# Patient Record
Sex: Male | Born: 1970 | Race: White | Hispanic: No | Smoking: Current every day smoker
Health system: Southern US, Community
[De-identification: ages and names within clinical notes are randomized; demographics above are authoritative.]

## PROBLEM LIST (undated history)

## (undated) ENCOUNTER — Emergency Department (HOSPITAL_COMMUNITY): Admission: EM | Payer: BC Managed Care – PPO

## (undated) DIAGNOSIS — F17200 Nicotine dependence, unspecified, uncomplicated: Secondary | ICD-10-CM

## (undated) DIAGNOSIS — R7303 Prediabetes: Secondary | ICD-10-CM

## (undated) DIAGNOSIS — J189 Pneumonia, unspecified organism: Secondary | ICD-10-CM

## (undated) DIAGNOSIS — N189 Chronic kidney disease, unspecified: Secondary | ICD-10-CM

## (undated) DIAGNOSIS — I10 Essential (primary) hypertension: Secondary | ICD-10-CM

## (undated) HISTORY — DX: Nicotine dependence, unspecified, uncomplicated: F17.200

## (undated) HISTORY — DX: Essential (primary) hypertension: I10

## (undated) HISTORY — PX: FOOT FRACTURE SURGERY: SHX645

## (undated) HISTORY — DX: Prediabetes: R73.03

---

## 2006-08-04 ENCOUNTER — Ambulatory Visit (HOSPITAL_BASED_OUTPATIENT_CLINIC_OR_DEPARTMENT_OTHER): Admission: RE | Admit: 2006-08-04 | Discharge: 2006-08-04 | Payer: Self-pay | Admitting: *Deleted

## 2006-08-04 ENCOUNTER — Encounter (INDEPENDENT_AMBULATORY_CARE_PROVIDER_SITE_OTHER): Payer: Self-pay | Admitting: *Deleted

## 2007-07-13 ENCOUNTER — Emergency Department (HOSPITAL_COMMUNITY): Admission: EM | Admit: 2007-07-13 | Discharge: 2007-07-13 | Payer: Self-pay | Admitting: Emergency Medicine

## 2007-09-23 ENCOUNTER — Encounter: Admission: RE | Admit: 2007-09-23 | Discharge: 2007-09-23 | Payer: Self-pay | Admitting: Sports Medicine

## 2007-09-28 ENCOUNTER — Ambulatory Visit (HOSPITAL_COMMUNITY): Admission: RE | Admit: 2007-09-28 | Discharge: 2007-09-28 | Payer: Self-pay | Admitting: Orthopedic Surgery

## 2010-09-23 NOTE — Op Note (Signed)
NAME:  Vincent Watkins, Vincent Watkins                 ACCOUNT NO.:  [id number]   MEDICAL RECORD NO.:  [id number]          PATIENT TYPE:  OIB   LOCATION:  5150                         FACILITY:  [facility]   PHYSICIAN:  Nadara Mustard, MD     DATE OF BIRTH:  [birth year]   DATE OF PROCEDURE:  09/28/2007  DATE OF DISCHARGE:  09/28/2007                               OPERATIVE REPORT   PREOPERATIVE DIAGNOSIS:  Closed left calcaneus fracture.   POSTOPERATIVE DIAGNOSIS:  Closed left calcaneus fracture.   PROCEDURE:  Open reduction and internal fixation left calcaneus.   SURGEON:  Nadara Mustard, MD   ANESTHESIA:  General.   ESTIMATED BLOOD LOSS:  Minimal.   ANTIBIOTICS:  1 g of Kefzol.   DRAINS:  None.   COMPLICATIONS:  None.   TOURNIQUET TIME:  Esmarch at the ankle for approximately 81 minutes.   DISPOSITION:  To PACU in stable condition.   INDICATIONS FOR PROCEDURE:  The patient is a 40 year old gentleman who  is about 2 weeks status post a closed left calcaneus fracture from  jumping off the porch. The patient presented with radiographs which  showed a Sander's two-part posterior facet fracture with a varus  collapse of the calcaneus.  Due to the deformity of intraarticular  fracture, the patient wished to proceed with surgical intervention.  Risks and benefits were discussed including infection, neurovascular  injury, persistent pain, nonhealing of the bone, nonhealing of the  wound, need for additional surgery.  The patient states that he  understands and wishes to proceed at this time.  Also, discussed the  patient had to absolutely stop smoking to ensure the wound to heal  properly, discussed the risk of wound breakdown with smoking.  The  patient states that he understands and agrees to stop smoking.   DESCRIPTION OF PROCEDURE:  The patient was brought to OR 1 and underwent  a general anesthetic.  After adequate level of anesthesia was obtained,  the patient was placed in the right lateral  decubitus position with the  left side up, and the left lower extremity was prepped using DuraPrep  and draped into the sterile field.  An extensile incision was made  laterally over the calcaneus, this started proximally, curved along the  lateral border of the heel and then curved distally.  Sharp dissection  was carried down the bone, and the entire soft tissue flap as well as  the tendons were elevated in a one-block of tissue.  K-wires were used  to elevate the flap and not to use any wrecks or retraction on the flap.  The lateral elements that were fractured were elevated.  The wound was  irrigated with normal saline.  The medial column was distracted,  elevated, and reduced.  The lateral wall was then replaced.  A Synthes  plate was placed laterally, and this was secured with locking and lag  screws.  General fluoroscopy verified reduction both AP and lateral  plains.  The wound was irrigated with normal saline.  The pins were  removed.  The subcu was closed using 2-0 Vicryl,  and skin was closed  using a modified Allgower-Donati suture with 2-0 nylon.  There was no  tension on the skin.  The wound was covered with Adaptic, orthopedic  sponges, sterile Webril, and a Coban dressing.  The patient was  extubated and taken to the PACU in the stable condition.  Plan for  overnight observation.  Discharged in the morning.      Nadara Mustard, MD  Electronically Signed     MVD/MEDQ  D:  09/28/2007  T:  09/29/2007  Job:  [id number]

## 2010-09-26 NOTE — Op Note (Signed)
NAME:  Vincent Watkins, Vincent Watkins                 ACCOUNT NO.:  [id number]   MEDICAL RECORD NO.:  [id number]          PATIENT TYPE:  AMB   LOCATION:  [facility]                         FACILITY:  [facility]   PHYSICIAN:  Alfonse Ras, MD   DATE OF BIRTH:  Sep 22, 1970   DATE OF PROCEDURE:  08/04/2006  DATE OF DISCHARGE:                               OPERATIVE REPORT   PREOPERATIVE DIAGNOSIS:  Lipoma of the right lower back.   POSTOPERATIVE DIAGNOSIS:  Lipoma of the right lower back.   PROCEDURE:  Excision of 5 cm lipoma of the right lower back.   ANESTHESIA:  Local MAC.   DESCRIPTION:  The patient was taken to the operating room, placed in the  left lateral decubitus position.  The right lower back was prepped and  draped in a normal sterile fashion.  Using a vertically-oriented  incision, I dissected down onto a well-encapsulated fatty tumor, which  was excised in its entirety without difficulty using Bovie  electrocautery.  Adequate hemostasis was ensured and the skin was closed  with subcuticular 3-0 Monocryl.  Steri-Strips and sterile dressings were  applied.  The patient tolerated the procedure well and went to PACU in  good condition.      Alfonse Ras, MD  Electronically Signed     KRE/MEDQ  D:  08/04/2006  T:  08/04/2006  Job:  [id number]

## 2011-02-04 LAB — CBC
RBC: 4.2 — ABNORMAL LOW
WBC: 7.8

## 2015-09-02 ENCOUNTER — Other Ambulatory Visit: Payer: Self-pay | Admitting: Family Medicine

## 2015-09-02 DIAGNOSIS — R7301 Impaired fasting glucose: Secondary | ICD-10-CM | POA: Diagnosis not present

## 2015-09-02 DIAGNOSIS — G629 Polyneuropathy, unspecified: Secondary | ICD-10-CM | POA: Diagnosis not present

## 2015-09-02 DIAGNOSIS — R351 Nocturia: Secondary | ICD-10-CM | POA: Diagnosis not present

## 2015-09-02 DIAGNOSIS — Z049 Encounter for examination and observation for unspecified reason: Secondary | ICD-10-CM | POA: Diagnosis not present

## 2015-09-02 DIAGNOSIS — T1590XA Foreign body on external eye, part unspecified, unspecified eye, initial encounter: Secondary | ICD-10-CM

## 2015-09-06 ENCOUNTER — Other Ambulatory Visit: Payer: Self-pay

## 2015-09-10 ENCOUNTER — Other Ambulatory Visit: Payer: Self-pay

## 2015-10-16 DIAGNOSIS — B86 Scabies: Secondary | ICD-10-CM | POA: Diagnosis not present

## 2015-10-21 ENCOUNTER — Ambulatory Visit (INDEPENDENT_AMBULATORY_CARE_PROVIDER_SITE_OTHER): Payer: BLUE CROSS/BLUE SHIELD | Admitting: Physician Assistant

## 2015-10-21 VITALS — BP 158/92 | HR 96 | Temp 97.9°F | Resp 18 | Ht 70.0 in | Wt 197.0 lb

## 2015-10-21 DIAGNOSIS — R21 Rash and other nonspecific skin eruption: Secondary | ICD-10-CM | POA: Diagnosis not present

## 2015-10-21 MED ORDER — PERMETHRIN 5 % EX CREA: 1.0000 "application " | TOPICAL_CREAM | Freq: Once | CUTANEOUS | Status: DC

## 2015-10-21 MED ORDER — IVERMECTIN 3 MG PO TABS: ORAL_TABLET | ORAL | Status: DC

## 2015-10-21 NOTE — Progress Notes (Signed)
Patient ID: Vincent Watkins, male     DOB: 09/18/1970, 45 y.o.    MRN: [id number]  PCP: Leonard Downing, MD  Chief Complaint  Patient presents with  . Medication Refill    permethrin 5%    Subjective:    HPI  Presents for evaluation of an itchy rash x 6 days, requesting a refill of permethrin cream.  He reports that on the first day of symptoms, he came home from work with an itchy rash on the upper thighs and groin. He believed that it was scabies. His son's girlfriend reportedly had scabies, and the patient believes that his son wore his underwear at her house and then did not adequately wash the clothing. He has been spraying his bed linens with RID spray. He saw his PCP and insisted that the rash was scabies, and he and his son, who also had symptoms, were given permethrin cream.  No new skin care or cleaning products. No new pets. No new medications other than the cream prescribed after the symptoms began.  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   Prior to Admission medications   Medication Sig Start Date End Date Taking? Authorizing Provider  permethrin (ELIMITE) 5 % cream Apply 1 application topically once.   Yes Historical Provider, MD     No Known Allergies   There are no active problems to display for this patient.    No family history on file.   Social History   Social History  . Marital Status: Married    Spouse Name: N/A  . Number of Children: N/A  . Years of Education: N/A   Occupational History  . Not on file.   Social History Main Topics  . Smoking status: Current Every Day Smoker  . Smokeless tobacco: Not on file  . Alcohol Use: No  . Drug Use: No  . Sexual Activity: Not on file   Other Topics Concern  . Not on file   Social History Narrative  . No narrative on file        Review of Systems As above. No CP, SOB, HA,  dizziness.      Objective:  Physical Exam  Constitutional: He is oriented to person, place, and time. He appears well-developed and well-nourished. He is active and cooperative. No distress.  BP 158/92 mmHg  Pulse 96  Temp(Src) 97.9 F (36.6 C) (Oral)  Resp 18  Ht 5\' 10"  (1.778 m)  Wt 197 lb (89.359 kg)  BMI 28.27 kg/m2  SpO2 100%   Eyes: Conjunctivae are normal.  Pulmonary/Chest: Effort normal.  Neurological: He is alert and oriented to person, place, and time.  Skin: Skin is warm and dry. Rash (diffuse excoriated lesions on the trunk, upper arms, upper thighs and groin, sparing the web spaces, genitalia, face/scap,) noted.  Psychiatric: He has a normal mood and affect. His speech is normal and behavior is normal.             Assessment & Plan:  1. Rash and nonspecific skin eruption I do not think that he has scabies, but the patient is insistent. Agree to treat with ivermectin and repeat permethrin, with advice that this is not likely to improve his symptoms and may, in fact, further dry out his skin and thereby exacerbate the symptoms. OOW note for the next several days. Advised to RTC if he is unable to  RTW on 10/24/2015. - permethrin (ELIMITE) 5 % cream; Apply 1 application topically once. (Patient not taking: Reported on 10/25/2015)  Dispense: 120 g; Refill: 1 - ivermectin (STROMECTOL) 3 MG TABS tablet; Take 6 tablets on day 1,2,8,9,15  Dispense: 30 tablet; Refill: 0   Fara Chute, PA-C Physician Assistant-Certified [facility]

## 2015-10-21 NOTE — Progress Notes (Signed)
   Subjective:    Patient ID: Vincent Watkins, male    DOB: [birth year], 45 y.o.   MRN: [id number]  Chief Complaint  Patient presents with  . Medication Refill    permethrin 5%   HPI  Patient is a 45 year old male who presents today with a rash x 6 days.  States last Tuesday when he came home from work he had a red, itchy rash all over his groin and upper thighs. He went to his primary care and was diagnosed with scabes on Wed, and prescribed permethrin creme, which he has used 3 x with minimal relief.  Last night he noticed a rash all over his chest, abdomen and back but does not believe it is scabies.  He thinks he may be having a allergic reaction to the RID he has been spraying on his mattress daily.    He has also bleached his carpet, washed his linen's daily, bagged and sealed all his clothes except for a weeks worth that he is washing daily in hot water in an effort to get rid of the scabies.  His son also has the same rash, they have been sharing the permethrin cream and he ran out early so he would like to have a refill.  "My son went on a date wearing my clothes and gave me this rash.  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  Denies fever, nausea, vomiting, diarrhea, or prior histrory of a similar rash.  Review of Systems All others negative except those listed in HPI.   There are no active problems to display for this patient.  No current outpatient prescriptions on file prior to visit.   No current facility-administered medications on file prior to visit.   No Known Allergies     Objective: BP 158/92 mmHg  Pulse 96  Temp(Src) 97.9 F (36.6 C) (Oral)  Resp 18  Ht 5\' 10"  (1.778 m)  Wt 197 lb (89.359 kg)  BMI 28.27 kg/m2  SpO2 100%   Physical Exam  Constitutional: He is oriented to person, place, and  time. He appears well-developed and well-nourished.  Cardiovascular: Normal rate, regular rhythm, normal heart sounds and intact distal pulses.   Pulmonary/Chest: Effort normal and breath sounds normal.  Neurological: He is alert and oriented to person, place, and time.  Skin: Rash noted.  -Diffuse, discrete excoriated red papules covering anterior and posterior torso. -Clustered excoriated papules with satellite  lesions on anterior upper extremities, in the proximal inguinal folds bilaterally. -Not present webs of fingers, extensor surface of wrists, or penis.  Psychiatric: He has a normal mood and affect. His behavior is normal. Judgment and thought content normal.          Assessment & Plan:  1. Rash and nonspecific skin eruption - permethrin (ELIMITE) 5 % cream; Apply 1 application topically once.  Dispense: 120 g; Refill: 1 - ivermectin (STROMECTOL) 3 MG TABS tablet; Take 6 tablets on day 1,2,8,9,15  Dispense: 30 tablet; Refill: 0  Patient prescribed permethrin cream to be applied once and ivermectin PO to be taken on days 1,2, 8, 9, 15.  Patient to return to work on Thursday if symptoms improving.    Instructed patient to return to PCP or this clinic if symptoms persist following treatment.  Adalid Beckmann P.Amyri Frenz, PA-S

## 2015-10-21 NOTE — Patient Instructions (Addendum)
  5 doses of oral treatment on days 1, 2, 8, 9, 15 Use jock itch treatment to help with itching. Take OTC Claritin each day to help reduce itching as well.    IF you received an x-ray today, you will receive an invoice from [facility]. Please contact [facility] at [phone] with questions or concerns regarding your invoice.   IF you received labwork today, you will receive an invoice from [employer]. Please contact [facility] at [phone] with questions or concerns regarding your invoice.   Our billing staff will not be able to assist you with questions regarding bills from these companies.  You will be contacted with the lab results as soon as they are available. The fastest way to get your results is to activate your My Chart account. Instructions are located on the last page of this paperwork. If you have not heard from [place] regarding the results in 2 weeks, please contact this office.     Drink at least 64 ounces of water daily. Consider a humidifier for the room where you sleep. Bathe once daily. Avoid using HOT water, as it dries skin. Avoid deodorant soaps (Dial is the worst!) and stick with gentle cleansers (I like Cetaphil Liquid Cleanser). After bathing, dry off completely, then apply a thick emollient cream (I like Cetaphil Moisturizing Cream). Apply the cream twice daily, or more!

## 2015-10-23 ENCOUNTER — Telehealth: Payer: Self-pay

## 2015-10-23 NOTE — Telephone Encounter (Signed)
Spoke with pt, he would like to speak to [place]. i tried to triage the call but pt was not forthcoming with information. It seems like he needs to recheck with you before he returns to work. I advised him that I could extend his note, no problem, but he insisted to talk with Chelle. I also advised him that he could follow up with any of our providers to get approval to go back to work. Chelle I tried to get more information. Thanks.

## 2015-10-25 ENCOUNTER — Ambulatory Visit (INDEPENDENT_AMBULATORY_CARE_PROVIDER_SITE_OTHER): Payer: BLUE CROSS/BLUE SHIELD | Admitting: Physician Assistant

## 2015-10-25 VITALS — BP 140/90 | HR 98 | Temp 98.1°F | Resp 17 | Ht 69.5 in | Wt 199.0 lb

## 2015-10-25 DIAGNOSIS — Z6828 Body mass index (BMI) 28.0-28.9, adult: Secondary | ICD-10-CM | POA: Insufficient documentation

## 2015-10-25 DIAGNOSIS — F17200 Nicotine dependence, unspecified, uncomplicated: Secondary | ICD-10-CM | POA: Insufficient documentation

## 2015-10-25 DIAGNOSIS — R21 Rash and other nonspecific skin eruption: Secondary | ICD-10-CM

## 2015-10-25 LAB — POCT SKIN KOH: SKIN KOH, POC: NEGATIVE

## 2015-10-25 MED ORDER — PREDNISONE 20 MG PO TABS: ORAL_TABLET | ORAL | Status: DC

## 2015-10-25 MED ORDER — HYDROXYZINE HCL 25 MG PO TABS: 12.5000 mg | ORAL_TABLET | Freq: Every evening | ORAL | Status: DC | PRN

## 2015-10-25 NOTE — Patient Instructions (Addendum)
Drink at least 64 ounces of water daily. Consider a humidifier for the room where you sleep. Bathe once daily. Avoid using HOT water, as it dries skin. Avoid deodorant soaps (Dial is the worst!) and stick with gentle cleansers (I like Cetaphil Liquid Cleanser). After bathing, dry off completely, then apply a thick emollient cream (I like Cetaphil Moisturizing Cream). Apply the cream twice daily, or more!  Take an OTC oral antihistamine each day (like Claritin, Allegra, Zyrtec).     IF you received an x-ray today, you will receive an invoice from [facility]. Please contact [facility] at [phone] with questions or concerns regarding your invoice.   IF you received labwork today, you will receive an invoice from [employer]. Please contact [facility] at [phone] with questions or concerns regarding your invoice.   Our billing staff will not be able to assist you with questions regarding bills from these companies.  You will be contacted with the lab results as soon as they are available. The fastest way to get your results is to activate your My Chart account. Instructions are located on the last page of this paperwork. If you have not heard from [place] regarding the results in 2 weeks, please contact this office.

## 2015-10-25 NOTE — Telephone Encounter (Signed)
Patient was seen today in clinic

## 2015-10-25 NOTE — Progress Notes (Signed)
   Patient ID: Vincent Watkins, male    DOB: [birth year], 45 y.o.   MRN: [id number]  PCP: Leonard Downing, MD  Subjective:   Chief Complaint  Patient presents with  . Rash    on shoulders and back     HPI Presents for evaluation of persistent itching and rash.  Seen for this here on 10/21/2015, reporting scabies infection. While I suspected that the rash was due to a non-infectious reactive process, he was adamant that it was scabies and wanting oral treatment, as permethrin from his PCP had failed to resolve the problem.   Has taken 2 doses of ivermectin. Has repeated permethrin topically. Still itching terribly and reports that the rash is worsening. He continues to develop new lesions on the upper arms, back, chest and abdomen, and upper legs. Unable to sleep due to the discomfort. Has not been able to return to work due to his supervisor's concern that his condition is contagious.  No new skin care products, pets, etc.  No fever, chills.    Review of Systems As above.    Patient Active Problem List   Diagnosis Date Noted  . Smoker      Prior to Admission medications   Medication Sig Start Date End Date Taking? Authorizing Provider  ivermectin (STROMECTOL) 3 MG TABS tablet Take 6 tablets on day 1,2,8,9,15 10/21/15  Yes Arlander Gillen, PA-C  permethrin (ELIMITE) 5 % cream Apply 1 application topically once. Patient not taking: Reported on 10/25/2015 10/21/15   Harrison Mons, PA-C     No Known Allergies     Objective:  Physical Exam  Constitutional: He is oriented to person, place, and time. He appears well-developed and well-nourished. He is active and cooperative. No distress.  BP 140/90 mmHg  Pulse 98  Temp(Src) 98.1 F (36.7 C) (Oral)  Resp 17  Ht 5' 9.5" (1.765 m)  Wt 199 lb (90.266 kg)  BMI 28.98 kg/m2  SpO2 98% Obviously uncomfortable.   Eyes: Conjunctivae are normal.  Pulmonary/Chest: Effort normal.  Neurological: He is alert and oriented to person,  place, and time.  Skin: Rash (diffuse excoriated lesions of the trunk, upper legs and upper arms, groin; no lesions in the web spaces, genitalia; some lesions are noted to have central clearing and fine scale at the edge.) noted.  Psychiatric: He has a normal mood and affect. His speech is normal and behavior is normal.    Results for orders placed or performed in visit on 10/25/15  POCT Skin KOH  Result Value Ref Range   Skin KOH, POC Negative           Assessment & Plan:   1. Rash and nonspecific skin eruption I do not believe that we are dealing with scabies, at least not at this point. I suspect that the topical permethrin has further exacerbated the dryness and irritation. Good skin hygiene. Oral steroid course. Atarax for HS if needed. OTC oral antihistamine daily PRN. - POCT Skin KOH - predniSONE (DELTASONE) 20 MG tablet; Take 3 PO QAM x3days, 2 PO QAM x3days, 1 PO QAM x3days  Dispense: 18 tablet; Refill: 0 - hydrOXYzine (ATARAX/VISTARIL) 25 MG tablet; Take 0.5-2 tablets (12.5-50 mg total) by mouth at bedtime as needed for itching.  Dispense: 30 tablet; Refill: 0   Fara Chute, PA-C Physician Assistant-Certified [facility]

## 2015-11-25 ENCOUNTER — Other Ambulatory Visit: Payer: Self-pay | Admitting: Physician Assistant

## 2015-11-25 ENCOUNTER — Telehealth: Payer: Self-pay

## 2015-11-25 DIAGNOSIS — R21 Rash and other nonspecific skin eruption: Secondary | ICD-10-CM

## 2015-11-25 NOTE — Telephone Encounter (Signed)
Pt needs a refill on meds given to him on 10/25/15 he did not remember the name of the meds  Best number [phone]

## 2015-11-26 MED ORDER — HYDROXYZINE HCL 25 MG PO TABS: 12.5000 mg | ORAL_TABLET | Freq: Every evening | ORAL | Status: DC | PRN

## 2015-11-26 NOTE — Telephone Encounter (Signed)
Meds ordered this encounter  Medications  . hydrOXYzine (ATARAX/VISTARIL) 25 MG tablet    Sig: Take 0.5-2 tablets (12.5-50 mg total) by mouth at bedtime as needed for itching.    Dispense:  30 tablet    Refill:  0    Order Specific Question:  Supervising Provider    Answer:  Brigitte Pulse, EVA N [[id number]]    If he feels that he needs a refill of prednisone, he needs re-evaluation.

## 2015-11-26 NOTE — Telephone Encounter (Signed)
Assessment & Plan:   1. Rash and nonspecific skin eruption I do not believe that we are dealing with scabies, at least not at this point. I suspect that the topical permethrin has further exacerbated the dryness and irritation. Good skin hygiene. Oral steroid course. Atarax for HS if needed. OTC oral antihistamine daily PRN. - POCT Skin KOH - predniSONE (DELTASONE) 20 MG tablet; Take 3 PO QAM x3days, 2 PO QAM x3days, 1 PO QAM x3days Dispense: 18 tablet; Refill: 0 - hydrOXYzine (ATARAX/VISTARIL) 25 MG tablet; Take 0.5-2 tablets (12.5-50 mg total) by mouth at bedtime as needed for itching. Dispense: 30 tablet; Refill: 0       Chelle?

## 2015-11-27 NOTE — Telephone Encounter (Signed)
Pt actually needs ivermectin.

## 2015-11-28 NOTE — Telephone Encounter (Signed)
He shouldn't need this again.  Please advise him to RTC for re-evaluation.

## 2015-11-28 NOTE — Telephone Encounter (Signed)
Advised pt of Chelle's message.

## 2016-01-30 DIAGNOSIS — I10 Essential (primary) hypertension: Secondary | ICD-10-CM | POA: Diagnosis not present

## 2016-02-05 ENCOUNTER — Encounter: Payer: Self-pay | Admitting: Neurology

## 2016-02-05 ENCOUNTER — Ambulatory Visit (INDEPENDENT_AMBULATORY_CARE_PROVIDER_SITE_OTHER): Payer: BLUE CROSS/BLUE SHIELD | Admitting: Neurology

## 2016-02-05 VITALS — BP 123/79 | HR 93 | Ht 69.5 in | Wt 193.2 lb

## 2016-02-05 DIAGNOSIS — R202 Paresthesia of skin: Secondary | ICD-10-CM | POA: Diagnosis not present

## 2016-02-05 NOTE — Patient Instructions (Signed)
   We will check EMG and NCV study on the left arm.

## 2016-02-05 NOTE — Progress Notes (Signed)
Reason for visit: Left hand paresthesias  Referring physician: Dr. Uvaldo Bristle Watkins is a 45 y.o. male  History of present illness:  Vincent Watkins is a 45 year old right-handed white male with a history of some numbness involving the left thumb that dates back about one year. The patient has had persistent sensory alterations, but within the last 2 months he developed some left-sided neck pain and neck swelling. This was initially fairly severe, it has improved some. The patient does not report any paresthesias or pain down the left arm with head turning. The patient reports no definite weakness of the arms. He denies any numbness in the right hand or in the feet. He will occasionally will have some sharp pains in the feet however. He denies any balance issues or difficulty controlling the bowels or the bladder. He is sent to this office for further evaluation of the ongoing persistent symptoms.  Past Medical History:  Diagnosis Date  . Hypertension   . Pre-diabetes   . Smoker     Past Surgical History:  Procedure Laterality Date  . FOOT FRACTURE SURGERY Left     Family History  Problem Relation Age of Onset  . Hyperlipidemia Mother   . Other Father     fall  . Cancer Brother     renal cell    Social history:  reports that he has been smoking.  He has a 13.00 pack-year smoking history. He has never used smokeless tobacco. He reports that he drinks about 33.6 oz of alcohol per week . He reports that he uses drugs, including Marijuana, about 7 times per week.  Medications:  Prior to Admission medications   Medication Sig Start Date End Date Taking? Authorizing Provider  lisinopril-hydrochlorothiazide (PRINZIDE,ZESTORETIC) 20-25 MG tablet TAKE 1/2 TABLET BY MOUTH EVERY DAY FOR HYPERTENSION 01/31/16   Historical Provider, MD     No Known Allergies  ROS:  Out of a complete 14 system review of symptoms, the patient complains only of the following symptoms, and all other  reviewed systems are negative.  Chest pain Blurred vision Cough, snoring Easy bleeding Numbness Snoring  Blood pressure 123/79, pulse 93, height 5' 9.5" (1.765 m), weight 193 lb 4 oz (87.7 kg).  Physical Exam  General: The patient is alert and cooperative at the time of the examination.  Eyes: Pupils are equal, round, and reactive to light. Discs are flat bilaterally.  Neck: The neck is supple, no carotid bruits are noted.  Respiratory: The respiratory examination is clear.  Cardiovascular: The cardiovascular examination reveals a regular rate and rhythm, no obvious murmurs or rubs are noted.  Neuromuscular: The patient lacks about 10 of full lateral rotation of the cervical spine bilaterally.  Skin: Extremities are without significant edema.  Neurologic Exam  Mental status: The patient is alert and oriented x 3 at the time of the examination. The patient has apparent normal recent and remote memory, with an apparently normal attention span and concentration ability.  Cranial nerves: Facial symmetry is present. There is good sensation of the face to pinprick and soft touch bilaterally. The strength of the facial muscles and the muscles to head turning and shoulder shrug are normal bilaterally. Speech is well enunciated, no aphasia or dysarthria is noted. Extraocular movements are full. Visual fields are full. The tongue is midline, and the patient has symmetric elevation of the soft palate. No obvious hearing deficits are noted.  Motor: The motor testing reveals 5 over 5 strength  of all 4 extremities. Good symmetric motor tone is noted throughout.  Sensory: Sensory testing is intact to pinprick, soft touch, vibration sensation, and position sense on all 4 extremities. No evidence of extinction is noted.  Coordination: Cerebellar testing reveals good finger-nose-finger and heel-to-shin bilaterally. Tinel sign on the left wrist is positive, negative on the right.  Gait and  station: Gait is normal. Tandem gait is slightly unsteady. Romberg is negative. No drift is seen.  Reflexes: Deep tendon reflexes are symmetric and normal bilaterally. Toes are downgoing bilaterally.   Assessment/Plan:  1. Left hand numbness  2. Left neck and shoulder discomfort  The left hand numbness has been persistent for year, the left neck discomfort began only about 2 months ago. The patient has no evidence of reflex asymmetry, or any weakness. The Tinel sign is positive at the left wrist, the patient could have carpal tunnel syndrome. Nerve conduction studies will be performed on both arms, EMG on the left arm. He will follow-up for the above study.  Jill Alexanders MD 02/05/2016 3:05 PM  [facility] [address] [place], [place] [address]  Phone [phone] Fax [phone]

## 2016-03-23 ENCOUNTER — Ambulatory Visit (INDEPENDENT_AMBULATORY_CARE_PROVIDER_SITE_OTHER): Payer: BLUE CROSS/BLUE SHIELD | Admitting: Neurology

## 2016-03-23 ENCOUNTER — Ambulatory Visit (INDEPENDENT_AMBULATORY_CARE_PROVIDER_SITE_OTHER): Payer: Self-pay | Admitting: Neurology

## 2016-03-23 ENCOUNTER — Encounter: Payer: Self-pay | Admitting: Neurology

## 2016-03-23 DIAGNOSIS — G5602 Carpal tunnel syndrome, left upper limb: Secondary | ICD-10-CM | POA: Diagnosis not present

## 2016-03-23 DIAGNOSIS — R202 Paresthesia of skin: Secondary | ICD-10-CM

## 2016-03-23 NOTE — Procedures (Signed)
     HISTORY:  Vincent Watkins is a 45 year old patient with a history of numbness of the left thumb. The patient denies any significant pain in the neck or shoulders. He is being evaluated for possible neuropathy or a cervical radiculopathy.  NERVE CONDUCTION STUDIES:  Nerve conduction studies were performed on both upper extremities. The distal motor latencies for the median nerves were prolonged on the left, normal on the right, with normal motor amplitudes for these nerves bilaterally. The distal motor latencies and motor amplitudes for the ulnar nerves were normal bilaterally. The F wave latencies and nerve conduction velocities for the median and ulnar nerves were normal bilaterally. The sensory latencies for the median nerves were prolonged on the left, normal on the right, and normal for the ulnar and radial sensory latencies bilaterally.  EMG STUDIES:  EMG study was performed on the left upper extremity:  The first dorsal interosseous muscle reveals 2 to 4 K units with full recruitment. No fibrillations or positive waves were noted. The abductor pollicis brevis muscle reveals 2 to 4 K units with full recruitment. No fibrillations or positive waves were noted. The extensor indicis proprius muscle reveals 1 to 3 K units with full recruitment. No fibrillations or positive waves were noted. The pronator teres muscle reveals 2 to 3 K units with full recruitment. No fibrillations or positive waves were noted. The biceps muscle reveals 1 to 2 K units with full recruitment. No fibrillations or positive waves were noted. The triceps muscle reveals 2 to 4 K units with full recruitment. No fibrillations or positive waves were noted. The anterior deltoid muscle reveals 2 to 3 K units with full recruitment. No fibrillations or positive waves were noted. The cervical paraspinal muscles were tested at 2 levels. No abnormalities of insertional activity were seen at either level tested. There was good  relaxation.   IMPRESSION:  Nerve conduction studies done on both upper extremities shows evidence of a mild left carpal tunnel syndrome. No other significant abnormalities were seen. EMG of the left upper extremity was unremarkable, without evidence of an overlying cervical radiculopathy.  Jill Alexanders MD 03/23/2016 9:43 AM  [facility] [address] [place], [place] [address]  Phone [phone] Fax [phone]

## 2016-03-23 NOTE — Progress Notes (Signed)
Please refer to EMG and nerve conduction study procedure note the

## 2016-03-23 NOTE — Progress Notes (Signed)
The patient comes in today for EMG and nerve conduction study evaluation. The study shows evidence of a mild left carpal tunnel syndrome. This is felt to be the etiology of his current symptoms.  The patient will be given a prescription for a wrist splint for the left wrist. If the patient is not improving over the next 2 months, he will call me, we'll consider a referral to a hand surgeon.  No evidence of an overlying cervical radiculopathy was seen.

## 2016-05-14 DIAGNOSIS — L2089 Other atopic dermatitis: Secondary | ICD-10-CM | POA: Diagnosis not present

## 2016-06-25 ENCOUNTER — Emergency Department (HOSPITAL_COMMUNITY)
Admission: EM | Admit: 2016-06-25 | Discharge: 2016-06-25 | Disposition: A | Discharge status: Home / Self Care | Payer: BLUE CROSS/BLUE SHIELD | Attending: Emergency Medicine | Admitting: Emergency Medicine

## 2016-06-25 ENCOUNTER — Ambulatory Visit (INDEPENDENT_AMBULATORY_CARE_PROVIDER_SITE_OTHER): Payer: BLUE CROSS/BLUE SHIELD | Admitting: Urgent Care

## 2016-06-25 VITALS — BP 118/64 | HR 82 | Temp 98.4°F | Resp 16 | Ht 69.5 in | Wt 191.0 lb

## 2016-06-25 DIAGNOSIS — F1010 Alcohol abuse, uncomplicated: Secondary | ICD-10-CM | POA: Diagnosis not present

## 2016-06-25 DIAGNOSIS — F17200 Nicotine dependence, unspecified, uncomplicated: Secondary | ICD-10-CM | POA: Diagnosis not present

## 2016-06-25 DIAGNOSIS — R824 Acetonuria: Secondary | ICD-10-CM

## 2016-06-25 DIAGNOSIS — R1011 Right upper quadrant pain: Secondary | ICD-10-CM | POA: Diagnosis not present

## 2016-06-25 DIAGNOSIS — J069 Acute upper respiratory infection, unspecified: Secondary | ICD-10-CM | POA: Diagnosis not present

## 2016-06-25 DIAGNOSIS — I10 Essential (primary) hypertension: Secondary | ICD-10-CM | POA: Diagnosis not present

## 2016-06-25 DIAGNOSIS — R319 Hematuria, unspecified: Secondary | ICD-10-CM

## 2016-06-25 DIAGNOSIS — R160 Hepatomegaly, not elsewhere classified: Secondary | ICD-10-CM | POA: Diagnosis not present

## 2016-06-25 DIAGNOSIS — R05 Cough: Secondary | ICD-10-CM | POA: Diagnosis not present

## 2016-06-25 DIAGNOSIS — F17210 Nicotine dependence, cigarettes, uncomplicated: Secondary | ICD-10-CM | POA: Diagnosis not present

## 2016-06-25 LAB — CBC WITH DIFFERENTIAL/PLATELET
Basophils Absolute: 0 10*3/uL (ref 0.0–0.1)
Basophils Relative: 0 %
EOS ABS: 0.1 10*3/uL (ref 0.0–0.7)
Eosinophils Relative: 1 %
HEMATOCRIT: 44.6 % (ref 39.0–52.0)
HEMOGLOBIN: 15.1 g/dL (ref 13.0–17.0)
LYMPHS ABS: 2 10*3/uL (ref 0.7–4.0)
LYMPHS PCT: 23 %
MCH: 32.8 pg (ref 26.0–34.0)
MCHC: 33.9 g/dL (ref 30.0–36.0)
MCV: 97 fL (ref 78.0–100.0)
Monocytes Absolute: 0.5 10*3/uL (ref 0.1–1.0)
Monocytes Relative: 6 %
NEUTROS ABS: 6.2 10*3/uL (ref 1.7–7.7)
NEUTROS PCT: 70 %
Platelets: 175 10*3/uL (ref 150–400)
RBC: 4.6 MIL/uL (ref 4.22–5.81)
RDW: 19.8 % — ABNORMAL HIGH (ref 11.5–15.5)
WBC: 8.9 10*3/uL (ref 4.0–10.5)

## 2016-06-25 LAB — POCT URINALYSIS DIP (MANUAL ENTRY)
Glucose, UA: NEGATIVE
Leukocytes, UA: NEGATIVE
Nitrite, UA: NEGATIVE
PH UA: 6
PROTEIN UA: NEGATIVE
RBC UA: NEGATIVE
SPEC GRAV UA: 1.025
Urobilinogen, UA: 1

## 2016-06-25 LAB — POCT CBC
GRANULOCYTE PERCENT: 68.7 % (ref 37–80)
HCT, POC: 42.3 % — AB (ref 43.5–53.7)
Hemoglobin: 15.1 g/dL (ref 14.1–18.1)
LYMPH, POC: 2.8 (ref 0.6–3.4)
MCH, POC: 34.3 pg — AB (ref 27–31.2)
MCHC: 35.7 g/dL — AB (ref 31.8–35.4)
MCV: 95.9 fL (ref 80–97)
MID (CBC): 0.6 (ref 0–0.9)
MPV: 7.7 fL (ref 0–99.8)
PLATELET COUNT, POC: 191 10*3/uL (ref 142–424)
POC Granulocyte: 7.6 — AB (ref 2–6.9)
POC LYMPH %: 25.8 % (ref 10–50)
POC MID %: 5.5 %M (ref 0–12)
RBC: 4.41 M/uL — AB (ref 4.69–6.13)
RDW, POC: 22.8 %
WBC: 11 10*3/uL — AB (ref 4.6–10.2)

## 2016-06-25 LAB — COMPREHENSIVE METABOLIC PANEL
ALK PHOS: 61 U/L (ref 38–126)
ALT: 36 U/L (ref 17–63)
AST: 46 U/L — ABNORMAL HIGH (ref 15–41)
Albumin: 4.3 g/dL (ref 3.5–5.0)
Anion gap: 13 (ref 5–15)
BILIRUBIN TOTAL: 0.8 mg/dL (ref 0.3–1.2)
BUN: 21 mg/dL — ABNORMAL HIGH (ref 6–20)
CALCIUM: 10 mg/dL (ref 8.9–10.3)
CO2: 25 mmol/L (ref 22–32)
CREATININE: 1.44 mg/dL — AB (ref 0.61–1.24)
Chloride: 93 mmol/L — ABNORMAL LOW (ref 101–111)
GFR calc non Af Amer: 57 mL/min — ABNORMAL LOW (ref >60)
GLUCOSE: 138 mg/dL — AB (ref 65–99)
Potassium: 3.8 mmol/L (ref 3.5–5.1)
SODIUM: 131 mmol/L — AB (ref 135–145)
Total Protein: 8.7 g/dL — ABNORMAL HIGH (ref 6.5–8.1)

## 2016-06-25 LAB — URINALYSIS, ROUTINE W REFLEX MICROSCOPIC
BACTERIA UA: NONE SEEN
Bilirubin Urine: NEGATIVE
Glucose, UA: NEGATIVE mg/dL
Hgb urine dipstick: NEGATIVE
KETONES UR: NEGATIVE mg/dL
Nitrite: NEGATIVE
PROTEIN: 30 mg/dL — AB
Specific Gravity, Urine: 1.019 (ref 1.005–1.030)
pH: 5 (ref 5.0–8.0)

## 2016-06-25 LAB — LIPASE, BLOOD: Lipase: 74 U/L — ABNORMAL HIGH (ref 11–51)

## 2016-06-25 MED ORDER — ALBUTEROL SULFATE HFA 108 (90 BASE) MCG/ACT IN AERS
1.0000 | INHALATION_SPRAY | RESPIRATORY_TRACT | Status: DC | PRN
  Administered 2016-06-25: 1 via RESPIRATORY_TRACT
  Filled 2016-06-25: qty 6.7

## 2016-06-25 NOTE — ED Triage Notes (Signed)
Pt sent to ED from Primary Care at [facility] for concern for liver problems.  Pt st's he is a everyday drinker and hasn't drank anything in 3 days.  Pt st's his stools have been yellow, urine very dark and pain in RUQ

## 2016-06-25 NOTE — ED Provider Notes (Signed)
[facility] DEPT Provider Note   CSN: [id number] Arrival date & time: 06/25/16  1559     History   Chief Complaint Chief Complaint  Patient presents with  . Abdominal Pain    HPI Vincent Watkins is a 46 y.o. male.  HPI Patient presents to the emergency room for evaluation of possible liver problems. The patient went to an urgent care today because he has been having trouble with a persistent cough. He is a heavy smoker smoking 1-2 packs per day. At the urgent care he mentioned that he drinks alcohol daily although he quit drinking a few days ago.  He noticed the other day that his stools were lighter in color and his urine was darker than Vincent. He denied any trouble with abdominal pain. According to the notes from the urgent care, they were concerned the patient might be having acute hepatitis or alcohol withdrawal. They suggested he come to the emergency room.  Patient denies any trouble with abdominal pain.  Denies any nausea or vomiting. He denies any tremulousness Past Medical History:  Diagnosis Date  . Hypertension   . Pre-diabetes   . Smoker     Patient Active Problem List   Diagnosis Date Noted  . Paresthesia 02/05/2016  . BMI 28.0-28.9,adult 10/25/2015  . Smoker     Past Surgical History:  Procedure Laterality Date  . FOOT FRACTURE SURGERY Left        Home Medications    Prior to Admission medications   Medication Sig Start Date End Date Taking? Authorizing Provider  lisinopril-hydrochlorothiazide (PRINZIDE,ZESTORETIC) 20-25 MG tablet TAKE 1/2 TABLET BY MOUTH EVERY DAY FOR HYPERTENSION 01/31/16   Historical Provider, MD    Family History Family History  Problem Relation Age of Onset  . Hyperlipidemia Mother   . Other Father     fall  . Cancer Brother     renal cell    Social History Social History  Substance Use Topics  . Smoking status: Current Every Day Smoker    Packs/day: 0.50    Years: 26.00  . Smokeless tobacco: Never Used  . Alcohol  use 33.6 oz/week    56 Standard drinks or equivalent per week     Comment: 8-9 beers per day     Allergies   Patient has no known allergies.   Review of Systems Review of Systems  All other systems reviewed and are negative.    Physical Exam Updated Vital Signs BP 110/76   Pulse 100   Temp 98.7 F (37.1 C) (Oral)   Resp 22   Ht 5' 9.5" (1.765 m)   Wt 86.6 kg   SpO2 98%   BMI 27.80 kg/m   Physical Exam  Constitutional: He appears well-developed and well-nourished. No distress.  HENT:  Head: Normocephalic and atraumatic.  Right Ear: External ear Vincent.  Left Ear: External ear Vincent.  Eyes: Conjunctivae are Vincent. Right eye exhibits no discharge. Left eye exhibits no discharge. No scleral icterus.  Neck: Neck supple. No tracheal deviation present.  Cardiovascular: Vincent rate, regular rhythm and intact distal pulses.   Pulmonary/Chest: Effort Vincent and breath sounds Vincent. No stridor. No respiratory distress. He has no wheezes. He has no rales.  Abdominal: Soft. Bowel sounds are Vincent. He exhibits no distension. There is no tenderness. There is no rebound and no guarding.  No masses palpated  Musculoskeletal: He exhibits no edema or tenderness.  Neurological: He is alert. He has Vincent strength. No cranial nerve deficit (no facial droop,  extraocular movements intact, no slurred speech) or sensory deficit. He exhibits Vincent muscle tone. He displays no seizure activity. Coordination Vincent.  No tremor  Skin: Skin is warm and dry. No rash noted.  Psychiatric: He has a Vincent mood and affect.  Nursing note and vitals reviewed.    ED Treatments / Results  Labs (all labs ordered are listed, but only abnormal results are displayed) Labs Reviewed  CBC WITH DIFFERENTIAL/PLATELET - Abnormal; Notable for the following:       Result Value   RDW 19.8 (*)    All other components within Vincent limits  COMPREHENSIVE METABOLIC PANEL - Abnormal; Notable for the following:     Sodium 131 (*)    Chloride 93 (*)    Glucose, Bld 138 (*)    BUN 21 (*)    Creatinine, Ser 1.44 (*)    Total Protein 8.7 (*)    AST 46 (*)    GFR calc non Af Amer 57 (*)    All other components within Vincent limits  LIPASE, BLOOD - Abnormal; Notable for the following:    Lipase 74 (*)    All other components within Vincent limits  URINALYSIS, ROUTINE W REFLEX MICROSCOPIC - Abnormal; Notable for the following:    Color, Urine AMBER (*)    APPearance HAZY (*)    Protein, ur 30 (*)    Leukocytes, UA TRACE (*)    Squamous Epithelial / LPF 0-5 (*)    All other components within Vincent limits    Procedures Procedures (including critical care time)  Medications Ordered in ED Medications  albuterol (PROVENTIL HFA;VENTOLIN HFA) 108 (90 Base) MCG/ACT inhaler 1-2 puff (1 puff Inhalation Given 06/25/16 1956)     Initial Impression / Assessment and Plan / ED Course  I have reviewed the triage vital signs and the nursing notes.  Pertinent labs & imaging results that were available during my care of the patient were reviewed by me and considered in my medical decision making (see chart for details).   patient's laboratory tests are reassuring. He has no abdominal tenderness on exam.  His LFTs show very slight elevation of his AST but are otherwise Vincent.  Lipase is slightly elevated as well but he does not have any tenderness and I do not think this is clinically significant.  Doubt cirrhosis, cholecystitis , hepatitis.  No tremor.  No signs of alcohol withdrawal, DTs.  BUN creatinine show an elevation in his creatinine.   Patient has not had any nausea vomiting.  Will give him oral fluids.    Pt felt like he was wheezing earlier.  None noted on my exam.  He requested an inhaler.  Discussed follow up with PCP  Final Clinical Impressions(s) / ED Diagnoses   Final diagnoses:  Upper respiratory tract infection, unspecified type    New Prescriptions New Prescriptions   No  medications on file     Dorie Rank, MD 06/25/16 2009

## 2016-06-25 NOTE — Discharge Instructions (Signed)
Follow-up with your primary doctor for a physical and to recheck your liver tests.  Avoid alcohol.  Please review the resource guide

## 2016-06-25 NOTE — Progress Notes (Signed)
MRN: [id number] DOB: 11/06/1970  Subjective:   Vincent Watkins is a 46 y.o. male presenting for chief complaint of Abdominal Pain; Alcohol Problem; Medication Refill (Lisinopril); and Depression (See screening)  Reports 2 week history of heavy alcohol use, >12 pack per day, has not drank alcohol in 3 days. Patient normaly drinks every after work but has been drinking much heavier lately. He is concerned that he is not having withdrawal symptoms. He is complaining of abdominal pain when he bends down, develops a knot that goes away when he sits up right. He has felt feverish, has had intermittent nausea with vomiting. His stool color has also turned yellow. He states that he is very upset and depressed because his sister took $160,000 from him and that she is worth 10 million and he is not. Smokes 1-2ppd. Is worried about his cough and would like his BP medication refilled.  Vincent Watkins has a current medication list which includes the following prescription(s): lisinopril-hydrochlorothiazide. Also has No Known Allergies.  Vincent Watkins  has a past medical history of Hypertension; Pre-diabetes; and Smoker. Also  has a past surgical history that includes Foot fracture surgery (Left).  Objective:   Vitals: BP 118/64   Pulse 82   Temp 98.4 F (36.9 C) (Oral)   Resp 16   Ht 5' 9.5" (1.765 m)   Wt 191 lb (86.6 kg)   SpO2 95%   BMI 27.80 kg/m   Physical Exam  Constitutional: He is oriented to person, place, and time. He appears well-developed and well-nourished.  Eyes: Scleral icterus is present.  Neck: Normal range of motion. Neck supple.  Cardiovascular: Normal rate, regular rhythm and intact distal pulses.  Exam reveals no gallop and no friction rub.   No murmur heard. Pulmonary/Chest: No respiratory distress. He has no wheezes. He has no rales.  Abdominal: Soft. Bowel sounds are normal. He exhibits distension. He exhibits no mass. There is tenderness (RUQ, hepatomegaly). There is no guarding.    Lymphadenopathy:    He has no cervical adenopathy.  Neurological: He is alert and oriented to person, place, and time.  Skin: Skin is warm and dry.   Results for orders placed or performed in visit on 06/25/16 (from the past 24 hour(s))  POCT urinalysis dipstick     Status: Abnormal   Collection Time: 06/25/16  2:58 PM  Result Value Ref Range   Color, UA yellow yellow   Clarity, UA clear clear   Glucose, UA negative negative   Bilirubin, UA moderate (A) negative   Ketones, POC UA small (15) (A) negative   Spec Grav, UA 1.025    Blood, UA negative negative   pH, UA 6.0    Protein Ur, POC negative negative   Urobilinogen, UA 1.0    Nitrite, UA Negative Negative   Leukocytes, UA Negative Negative  POCT CBC     Status: Abnormal   Collection Time: 06/25/16  3:12 PM  Result Value Ref Range   WBC 11.0 (A) 4.6 - 10.2 K/uL   Lymph, poc 2.8 0.6 - 3.4   POC LYMPH PERCENT 25.8 10 - 50 %L   MID (cbc) 0.6 0 - 0.9   POC MID % 5.5 0 - 12 %M   POC Granulocyte 7.6 (A) 2 - 6.9   Granulocyte percent 68.7 37 - 80 %G   RBC 4.41 (A) 4.69 - 6.13 M/uL   Hemoglobin 15.1 14.1 - 18.1 g/dL   HCT, POC 42.3 (A) 43.5 - 53.7 %   MCV 95.9  80 - 97 fL   MCH, POC 34.3 (A) 27 - 31.2 pg   MCHC 35.7 (A) 31.8 - 35.4 g/dL   RDW, POC 22.8 %   Platelet Count, POC 191 142 - 424 K/uL   MPV 7.7 0 - 99.8 fL   Assessment and Plan :   This case was precepted with Dr. Mitchel Honour.   1. Right upper quadrant abdominal pain 2. Hepatomegaly 3. Alcohol abuse 4. Essential hypertension 5. Hematuria, unspecified type 6. Ketonuria - Patient's exam and history is concerning for acute alcoholic hepatitis, liver failure. He may also be starting alcoholic withdrawal. We discussed his differential and advised that he report to the [facility] ER immediately. His daughter agreed to drive him there. Case reported to [facility] ER Charge Nurse who verbalized understanding.  Jaynee Eagles, PA-C [facility] [id number] 06/25/2016  2:49 PM

## 2016-06-25 NOTE — Patient Instructions (Addendum)
Please report to the [facility] ER immediately. I am concerned that you have liver hepatitis associated with your alcohol use. You are also having withdrawal of from not having had alcohol in 3 days. Both need an emergency evaluation and treatment.     Alcoholic Hepatitis Alcoholic hepatitis is liver inflammation caused by drinking alcohol. This inflammation decreases the liver's ability to function normally.  CAUSES  Alcoholic hepatitis is caused by heavy drinking.  RISK FACTORS You may have an increased risk of alcoholic hepatitis if:   You drink large amounts of alcohol.  You have been drinking heavily for years.  You binge drink.  You are male.  You are obese.  You have had infectious hepatitis.  You are malnourished.  You have close family members who have had alcoholic hepatitis. SIGNS AND SYMPTOMS  Abdominal pain.  A swollen abdomen.  Loss of appetite.  Unintentional weight loss.  Nausea and vomiting.  Tiredness.  Dry mouth.  Severe thirst.  A yellow tone to the skin and whites of the eyes (jaundice).  Spidery veins, especially across the skin of the abdomen.  Unusual bleeding.  Itching.  Trouble thinking clearly.  Memory problems.  Mood changes.  Confusion.  Numbness and tingling in the feet and legs. DIAGNOSIS  Alcoholic hepatitis is diagnosed with blood tests that show problems with liver function. Additional tests may also be done, such as:  An ultrasound.  A CT scan.  An MRI scan.  A liver biopsy. For this test, a small sample of the liver will be taken and examined for evidence of liver damage. TREATMENT The most important step you can take to treat alcoholic hepatitis is to stop drinking alcohol. If you are addicted to alcohol, your health care provider will help you create a plan to quit. It may involve:  Taking medicine to decrease withdrawal symptoms.  Entering a program to help you stop drinking.  Joining a support  group. Additional treatment for alcoholic hepatitis may include:  Medicines such as steroids. The medicines will help decrease the inflammation.  Diet. Your health care provider might ask you to undergo nutritional counseling and follow a diet. You may also need to take dietary supplements and vitamins.  A liver transplant. This procedure is performed in very severe cases. It is only performed on people who have totally stopped drinking and can commit to never drinking alcohol again. HOME CARE INSTRUCTIONS  Do not drink alcohol.  Do not use medicines or eat foods that contain alcohol.  Take medicines only as directed by your health care provider.  Follow dietary instructions carefully.  Keep all follow-up visits as directed by your health care provider. This is important. SEEK MEDICAL CARE IF:  You have a fever.  You do not have your usual appetite.  You have flu-like symptoms such as fatigue, weakness, or muscle aches.  You feel nauseous or vomit.  You bruise easily.  Your urine is very dark.  You have new abdominal pain. SEEK IMMEDIATE MEDICAL CARE IF:  There is blood in your vomit.  You develop jaundice.  Your skin itches severely.  Your legs swell.  Your stomach appears bloated.  You have black, tarry-appearing stools.  You bleed easily.  You are confused or not thinking clearly.  You have a seizure. MAKE SURE YOU:  Understand these instructions.  Will watch your condition.  Will get help right away if you are not doing well or get worse. This information is not intended to replace advice  given to you by your health care provider. Make sure you discuss any questions you have with your health care provider. Document Released: 11/22/2013 Document Reviewed: 11/22/2013 Elsevier Interactive Patient Education  2017 [employer].     IF you received an x-ray today, you will receive an invoice from [facility]. Please contact [facility]  [facility] at [phone] with questions or concerns regarding your invoice.   IF you received labwork today, you will receive an invoice from [facility]. Please contact [facility] at [phone] with questions or concerns regarding your invoice.   Our billing staff will not be able to assist you with questions regarding bills from these companies.  You will be contacted with the lab results as soon as they are available. The fastest way to get your results is to activate your My Chart account. Instructions are located on the last page of this paperwork. If you have not heard from [place] regarding the results in 2 weeks, please contact this office.

## 2016-06-25 NOTE — ED Notes (Signed)
Pt verbalized understanding discharge instructions and denies any further needs or questions at this time. VS stable, ambulatory and steady gait.   

## 2016-06-26 LAB — CMP14+EGFR
ALK PHOS: 74 IU/L (ref 39–117)
ALT: 33 IU/L (ref 0–44)
AST: 37 IU/L (ref 0–40)
Albumin/Globulin Ratio: 1.3 (ref 1.2–2.2)
Albumin: 4.6 g/dL (ref 3.5–5.5)
BILIRUBIN TOTAL: 0.9 mg/dL (ref 0.0–1.2)
BUN / CREAT RATIO: 15 (ref 9–20)
BUN: 22 mg/dL (ref 6–24)
CHLORIDE: 93 mmol/L — AB (ref 96–106)
CO2: 21 mmol/L (ref 18–29)
CREATININE: 1.44 mg/dL — AB (ref 0.76–1.27)
Calcium: 10 mg/dL (ref 8.7–10.2)
GFR calc Af Amer: 66 mL/min/{1.73_m2} (ref >59)
GFR calc non Af Amer: 57 mL/min/{1.73_m2} — ABNORMAL LOW (ref >59)
GLUCOSE: 156 mg/dL — AB (ref 65–99)
Globulin, Total: 3.5 g/dL (ref 1.5–4.5)
Potassium: 3.8 mmol/L (ref 3.5–5.2)
Sodium: 134 mmol/L (ref 134–144)
Total Protein: 8.1 g/dL (ref 6.0–8.5)

## 2016-06-26 LAB — HEMOGLOBIN A1C
ESTIMATED AVERAGE GLUCOSE: 108 mg/dL
HEMOGLOBIN A1C: 5.4 % (ref 4.8–5.6)

## 2016-07-02 ENCOUNTER — Encounter: Payer: Self-pay | Admitting: Urgent Care

## 2018-11-10 DIAGNOSIS — Z20828 Contact with and (suspected) exposure to other viral communicable diseases: Secondary | ICD-10-CM | POA: Diagnosis not present

## 2018-12-25 DIAGNOSIS — R197 Diarrhea, unspecified: Secondary | ICD-10-CM | POA: Diagnosis not present

## 2018-12-25 DIAGNOSIS — Z9114 Patient's other noncompliance with medication regimen: Secondary | ICD-10-CM | POA: Diagnosis not present

## 2018-12-25 DIAGNOSIS — R51 Headache: Secondary | ICD-10-CM | POA: Diagnosis not present

## 2018-12-25 DIAGNOSIS — F17200 Nicotine dependence, unspecified, uncomplicated: Secondary | ICD-10-CM | POA: Diagnosis not present

## 2018-12-25 DIAGNOSIS — F1210 Cannabis abuse, uncomplicated: Secondary | ICD-10-CM | POA: Diagnosis not present

## 2018-12-25 DIAGNOSIS — Z79899 Other long term (current) drug therapy: Secondary | ICD-10-CM | POA: Diagnosis not present

## 2018-12-25 DIAGNOSIS — H538 Other visual disturbances: Secondary | ICD-10-CM | POA: Diagnosis not present

## 2018-12-25 DIAGNOSIS — F1010 Alcohol abuse, uncomplicated: Secondary | ICD-10-CM | POA: Diagnosis not present

## 2018-12-25 DIAGNOSIS — R42 Dizziness and giddiness: Secondary | ICD-10-CM | POA: Diagnosis not present

## 2018-12-25 DIAGNOSIS — R7303 Prediabetes: Secondary | ICD-10-CM | POA: Diagnosis not present

## 2018-12-25 DIAGNOSIS — F17210 Nicotine dependence, cigarettes, uncomplicated: Secondary | ICD-10-CM | POA: Diagnosis not present

## 2018-12-25 DIAGNOSIS — I10 Essential (primary) hypertension: Secondary | ICD-10-CM | POA: Diagnosis not present

## 2018-12-25 DIAGNOSIS — H532 Diplopia: Secondary | ICD-10-CM | POA: Diagnosis not present

## 2018-12-26 DIAGNOSIS — R7303 Prediabetes: Secondary | ICD-10-CM | POA: Diagnosis not present

## 2018-12-26 DIAGNOSIS — G319 Degenerative disease of nervous system, unspecified: Secondary | ICD-10-CM | POA: Diagnosis not present

## 2018-12-26 DIAGNOSIS — H538 Other visual disturbances: Secondary | ICD-10-CM | POA: Diagnosis not present

## 2018-12-26 DIAGNOSIS — I10 Essential (primary) hypertension: Secondary | ICD-10-CM | POA: Diagnosis not present

## 2018-12-26 DIAGNOSIS — H532 Diplopia: Secondary | ICD-10-CM | POA: Diagnosis not present

## 2018-12-27 DIAGNOSIS — H2513 Age-related nuclear cataract, bilateral: Secondary | ICD-10-CM | POA: Diagnosis not present

## 2018-12-27 DIAGNOSIS — H532 Diplopia: Secondary | ICD-10-CM | POA: Diagnosis not present

## 2018-12-27 DIAGNOSIS — H0288B Meibomian gland dysfunction left eye, upper and lower eyelids: Secondary | ICD-10-CM | POA: Diagnosis not present

## 2018-12-27 DIAGNOSIS — R42 Dizziness and giddiness: Secondary | ICD-10-CM | POA: Diagnosis not present

## 2018-12-27 DIAGNOSIS — H0288A Meibomian gland dysfunction right eye, upper and lower eyelids: Secondary | ICD-10-CM | POA: Diagnosis not present

## 2019-01-26 DIAGNOSIS — I10 Essential (primary) hypertension: Secondary | ICD-10-CM | POA: Diagnosis not present

## 2019-01-26 DIAGNOSIS — E119 Type 2 diabetes mellitus without complications: Secondary | ICD-10-CM | POA: Diagnosis not present

## 2019-02-20 ENCOUNTER — Encounter (HOSPITAL_COMMUNITY): Payer: Self-pay | Admitting: Emergency Medicine

## 2019-02-20 ENCOUNTER — Other Ambulatory Visit: Payer: Self-pay

## 2019-02-20 ENCOUNTER — Emergency Department (HOSPITAL_COMMUNITY): Payer: BLUE CROSS/BLUE SHIELD

## 2019-02-20 ENCOUNTER — Emergency Department (HOSPITAL_COMMUNITY)
Admission: EM | Admit: 2019-02-20 | Discharge: 2019-02-20 | Disposition: A | Discharge status: Home / Self Care | Payer: BLUE CROSS/BLUE SHIELD | Attending: Emergency Medicine | Admitting: Emergency Medicine

## 2019-02-20 DIAGNOSIS — I10 Essential (primary) hypertension: Secondary | ICD-10-CM | POA: Diagnosis not present

## 2019-02-20 DIAGNOSIS — F1210 Cannabis abuse, uncomplicated: Secondary | ICD-10-CM | POA: Diagnosis not present

## 2019-02-20 DIAGNOSIS — R072 Precordial pain: Secondary | ICD-10-CM | POA: Diagnosis not present

## 2019-02-20 DIAGNOSIS — R0902 Hypoxemia: Secondary | ICD-10-CM | POA: Diagnosis not present

## 2019-02-20 DIAGNOSIS — Z79899 Other long term (current) drug therapy: Secondary | ICD-10-CM | POA: Insufficient documentation

## 2019-02-20 DIAGNOSIS — R079 Chest pain, unspecified: Secondary | ICD-10-CM | POA: Insufficient documentation

## 2019-02-20 DIAGNOSIS — F17210 Nicotine dependence, cigarettes, uncomplicated: Secondary | ICD-10-CM | POA: Insufficient documentation

## 2019-02-20 LAB — COMPREHENSIVE METABOLIC PANEL
ALT: 33 U/L (ref 0–44)
AST: 35 U/L (ref 15–41)
Albumin: 3.8 g/dL (ref 3.5–5.0)
Alkaline Phosphatase: 54 U/L (ref 38–126)
Anion gap: 11 (ref 5–15)
BUN: 8 mg/dL (ref 6–20)
CO2: 20 mmol/L — ABNORMAL LOW (ref 22–32)
Calcium: 8.8 mg/dL — ABNORMAL LOW (ref 8.9–10.3)
Chloride: 104 mmol/L (ref 98–111)
Creatinine, Ser: 0.83 mg/dL (ref 0.61–1.24)
GFR calc Af Amer: >60 mL/min (ref >60)
GFR calc non Af Amer: >60 mL/min (ref >60)
Glucose, Bld: 126 mg/dL — ABNORMAL HIGH (ref 70–99)
Potassium: 4.5 mmol/L (ref 3.5–5.1)
Sodium: 135 mmol/L (ref 135–145)
Total Bilirubin: 0.6 mg/dL (ref 0.3–1.2)
Total Protein: 7.3 g/dL (ref 6.5–8.1)

## 2019-02-20 LAB — CBC
HCT: 41.9 % (ref 39.0–52.0)
Hemoglobin: 14 g/dL (ref 13.0–17.0)
MCH: 34.3 pg — ABNORMAL HIGH (ref 26.0–34.0)
MCHC: 33.4 g/dL (ref 30.0–36.0)
MCV: 102.7 fL — ABNORMAL HIGH (ref 80.0–100.0)
Platelets: 103 10*3/uL — ABNORMAL LOW (ref 150–400)
RBC: 4.08 MIL/uL — ABNORMAL LOW (ref 4.22–5.81)
RDW: 14 % (ref 11.5–15.5)
WBC: 3.6 10*3/uL — ABNORMAL LOW (ref 4.0–10.5)
nRBC: 0 % (ref 0.0–0.2)

## 2019-02-20 LAB — TROPONIN I (HIGH SENSITIVITY)
Troponin I (High Sensitivity): 5 ng/L (ref <18)
Troponin I (High Sensitivity): 4 ng/L (ref <18)

## 2019-02-20 LAB — MAGNESIUM: Magnesium: 1.7 mg/dL (ref 1.7–2.4)

## 2019-02-20 LAB — CBG MONITORING, ED: Glucose-Capillary: 119 mg/dL — ABNORMAL HIGH (ref 70–99)

## 2019-02-20 NOTE — ED Provider Notes (Signed)
[place] EMERGENCY DEPARTMENT Provider Note   CSN: [id number] Arrival date & time: 02/20/19  [id number]     History   Chief Complaint Chief Complaint  Patient presents with  . Chest Pain    HPI Vincent Watkins is a 48 y.o. male.     HPI  48 yo male with chest pain that began today at 0800 when on the way to work.  Pain was left sided and prssure at about 3/10.  EMS gave nitro and aspirin with relief.  Patient reports having some intermittent chest pain for past 6 months- unable to note inciting or decreasing causes. States today pain was worse and lasted longer which is why he called 911. +smoker, hypertension, prediabetes, father with mi in 74s    Past Medical History:  Diagnosis Date  . Hypertension   . Pre-diabetes   . Smoker     Patient Active Problem List   Diagnosis Date Noted  . Paresthesia 02/05/2016  . BMI 28.0-28.9,adult 10/25/2015  . Smoker     Past Surgical History:  Procedure Laterality Date  . FOOT FRACTURE SURGERY Left         Home Medications    Prior to Admission medications   Medication Sig Start Date End Date Taking? Authorizing Provider  lisinopril-hydrochlorothiazide (PRINZIDE,ZESTORETIC) 20-25 MG tablet TAKE 1/2 TABLET BY MOUTH EVERY DAY FOR HYPERTENSION 01/31/16   [provider]    Family History Family History  Problem Relation Age of Onset  . Hyperlipidemia Mother   . Other Father        fall  . Cancer Brother        renal cell    Social History Social History   Tobacco Use  . Smoking status: Current Every Day Smoker    Packs/day: 0.50    Years: 26.00    Pack years: 13.00  . Smokeless tobacco: Never Used  Substance Use Topics  . Alcohol use: Yes    Alcohol/week: 56.0 standard drinks    Types: 56 Standard drinks or equivalent per week    Comment: 8-9 beers per day  . Drug use: Yes    Frequency: 7.0 times per week    Types: Marijuana     Allergies   Patient has no known allergies.    Review of Systems Review of Systems  All other systems reviewed and are negative.    Physical Exam Updated Vital Signs BP (!) 137/94 (BP Location: Right Arm)   Pulse 92   Temp 98.5 F (36.9 C) (Oral)   Resp 16   SpO2 98%   Physical Exam Vitals signs and nursing note reviewed.  Constitutional:      Appearance: He is well-developed. He is obese.  HENT:     Head: Normocephalic and atraumatic.  Eyes:     Pupils: Pupils are equal, round, and reactive to light.  Neck:     Musculoskeletal: Normal range of motion.  Cardiovascular:     Rate and Rhythm: Normal rate and regular rhythm.     Heart sounds: Normal heart sounds.  Pulmonary:     Effort: Pulmonary effort is normal.     Breath sounds: Normal breath sounds.  Abdominal:     General: Bowel sounds are normal.     Palpations: Abdomen is soft.  Musculoskeletal: Normal range of motion.     Right lower leg: He exhibits no tenderness. No edema.     Left lower leg: He exhibits no tenderness. No edema.  Skin:  General: Skin is warm and dry.     Capillary Refill: Capillary refill takes less than 2 seconds.  Neurological:     General: No focal deficit present.     Mental Status: He is alert.      ED Treatments / Results  Labs (all labs ordered are listed, but only abnormal results are displayed) Labs Reviewed  CBC  COMPREHENSIVE METABOLIC PANEL  MAGNESIUM  CBG MONITORING, ED  TROPONIN I (HIGH SENSITIVITY)    EKG None  Radiology No results found.  Procedures Procedures (including critical care time)  Medications Ordered in ED Medications - No data to display   Initial Impression / Assessment and Plan / ED Course  I have reviewed the triage vital signs and the nursing notes.  Pertinent labs & imaging results that were available during my care of the patient were reviewed by me and considered in my medical decision making (see chart for details).    48 year old man with substernal chest pain relieved with  nitroglycerin.  EKG without acute ischemic changes.  He has had troponin and repeat troponin that are normal.  I consulted cardiology.  Patient is requesting to leave.  I discussed the risks and benefits with the patient.  He continues to opt for discharge.  Cardiology has not been able to see him at this point.  Patient is continue to be pain-free here in the ED.  He is advised regarding return precautions and given referral to cardiology. The patient has decided to leave against medical advice.  The patient had a normal mental status examination and understands his/ her condition and the risks of leaving including  permanent disability and death. The patient has had an opportunity to ask  questions about his medical condition. The patient has been informed  that he/she may return for care at any time  Final Clinical Impressions(s) / ED Diagnoses   Final diagnoses:  Chest pain, unspecified type    ED Discharge Orders    None       Pattricia Boss, MD 02/20/19 1332

## 2019-02-20 NOTE — ED Notes (Signed)
CBG 119 

## 2019-02-20 NOTE — ED Notes (Signed)
Patient verbalizes understanding of discharge instructions. Opportunity for questioning and answers were provided. Pt discharged from ED. 

## 2019-02-20 NOTE — Discharge Instructions (Addendum)
Please call [facility] for follow-up appointment Return to the emergency department if you have any return of your symptoms especially chest pain, shortness of breath, or weakness.

## 2019-02-20 NOTE — ED Triage Notes (Signed)
Pt arrives to ED with complaints of left sided chest pain that radiates to his left arm causing left arm numbness while driving to work this morning. Patient received 324 aspirin and x1 nitro via EMS. Patient has hx of HTN. No CP at this time.

## 2020-03-18 ENCOUNTER — Emergency Department (HOSPITAL_COMMUNITY): Payer: BLUE CROSS/BLUE SHIELD

## 2020-03-18 ENCOUNTER — Other Ambulatory Visit: Payer: Self-pay

## 2020-03-18 ENCOUNTER — Inpatient Hospital Stay (HOSPITAL_COMMUNITY)
Admission: EM | Admit: 2020-03-18 | Discharge: 2020-03-21 | DRG: 205 | Disposition: A | Discharge status: Home / Self Care | Payer: BLUE CROSS/BLUE SHIELD | Attending: Internal Medicine | Admitting: Internal Medicine

## 2020-03-18 DIAGNOSIS — Y903 Blood alcohol level of 60-79 mg/100 ml: Secondary | ICD-10-CM | POA: Diagnosis present

## 2020-03-18 DIAGNOSIS — W01190A Fall on same level from slipping, tripping and stumbling with subsequent striking against furniture, initial encounter: Secondary | ICD-10-CM | POA: Diagnosis not present

## 2020-03-18 DIAGNOSIS — Z20822 Contact with and (suspected) exposure to covid-19: Secondary | ICD-10-CM | POA: Diagnosis present

## 2020-03-18 DIAGNOSIS — K760 Fatty (change of) liver, not elsewhere classified: Secondary | ICD-10-CM | POA: Diagnosis present

## 2020-03-18 DIAGNOSIS — R072 Precordial pain: Secondary | ICD-10-CM

## 2020-03-18 DIAGNOSIS — D539 Nutritional anemia, unspecified: Secondary | ICD-10-CM | POA: Diagnosis not present

## 2020-03-18 DIAGNOSIS — J189 Pneumonia, unspecified organism: Secondary | ICD-10-CM | POA: Diagnosis present

## 2020-03-18 DIAGNOSIS — Z79899 Other long term (current) drug therapy: Secondary | ICD-10-CM

## 2020-03-18 DIAGNOSIS — Z83438 Family history of other disorder of lipoprotein metabolism and other lipidemia: Secondary | ICD-10-CM | POA: Diagnosis not present

## 2020-03-18 DIAGNOSIS — K648 Other hemorrhoids: Secondary | ICD-10-CM | POA: Diagnosis present

## 2020-03-18 DIAGNOSIS — E119 Type 2 diabetes mellitus without complications: Secondary | ICD-10-CM

## 2020-03-18 DIAGNOSIS — S2232XA Fracture of one rib, left side, initial encounter for closed fracture: Secondary | ICD-10-CM | POA: Diagnosis present

## 2020-03-18 DIAGNOSIS — F1010 Alcohol abuse, uncomplicated: Secondary | ICD-10-CM | POA: Diagnosis present

## 2020-03-18 DIAGNOSIS — I10 Essential (primary) hypertension: Secondary | ICD-10-CM | POA: Diagnosis present

## 2020-03-18 DIAGNOSIS — R195 Other fecal abnormalities: Secondary | ICD-10-CM

## 2020-03-18 DIAGNOSIS — R091 Pleurisy: Secondary | ICD-10-CM | POA: Diagnosis present

## 2020-03-18 DIAGNOSIS — F17210 Nicotine dependence, cigarettes, uncomplicated: Secondary | ICD-10-CM | POA: Diagnosis present

## 2020-03-18 DIAGNOSIS — Z789 Other specified health status: Secondary | ICD-10-CM

## 2020-03-18 DIAGNOSIS — K625 Hemorrhage of anus and rectum: Secondary | ICD-10-CM | POA: Diagnosis present

## 2020-03-18 DIAGNOSIS — Z8051 Family history of malignant neoplasm of kidney: Secondary | ICD-10-CM | POA: Diagnosis not present

## 2020-03-18 DIAGNOSIS — J918 Pleural effusion in other conditions classified elsewhere: Secondary | ICD-10-CM | POA: Diagnosis not present

## 2020-03-18 DIAGNOSIS — D500 Iron deficiency anemia secondary to blood loss (chronic): Secondary | ICD-10-CM | POA: Diagnosis present

## 2020-03-18 DIAGNOSIS — Z7982 Long term (current) use of aspirin: Secondary | ICD-10-CM | POA: Diagnosis not present

## 2020-03-18 DIAGNOSIS — Z7984 Long term (current) use of oral hypoglycemic drugs: Secondary | ICD-10-CM | POA: Diagnosis not present

## 2020-03-18 DIAGNOSIS — K922 Gastrointestinal hemorrhage, unspecified: Secondary | ICD-10-CM | POA: Diagnosis not present

## 2020-03-18 DIAGNOSIS — J90 Pleural effusion, not elsewhere classified: Secondary | ICD-10-CM | POA: Diagnosis not present

## 2020-03-18 DIAGNOSIS — K8020 Calculus of gallbladder without cholecystitis without obstruction: Secondary | ICD-10-CM

## 2020-03-18 DIAGNOSIS — W1830XA Fall on same level, unspecified, initial encounter: Secondary | ICD-10-CM | POA: Diagnosis present

## 2020-03-18 DIAGNOSIS — F1090 Alcohol use, unspecified, uncomplicated: Secondary | ICD-10-CM

## 2020-03-18 DIAGNOSIS — R0789 Other chest pain: Secondary | ICD-10-CM | POA: Diagnosis not present

## 2020-03-18 DIAGNOSIS — R590 Localized enlarged lymph nodes: Secondary | ICD-10-CM

## 2020-03-18 DIAGNOSIS — Z9889 Other specified postprocedural states: Secondary | ICD-10-CM

## 2020-03-18 LAB — COMPREHENSIVE METABOLIC PANEL
ALT: 22 U/L (ref 0–44)
ALT: 21 U/L (ref 0–44)
AST: 31 U/L (ref 15–41)
AST: 25 U/L (ref 15–41)
Albumin: 2.9 g/dL — ABNORMAL LOW (ref 3.5–5.0)
Albumin: 3.3 g/dL — ABNORMAL LOW (ref 3.5–5.0)
Alkaline Phosphatase: 49 U/L (ref 38–126)
Alkaline Phosphatase: 48 U/L (ref 38–126)
Anion gap: 10 (ref 5–15)
Anion gap: 12 (ref 5–15)
BUN: 11 mg/dL (ref 6–20)
BUN: 10 mg/dL (ref 6–20)
CO2: 20 mmol/L — ABNORMAL LOW (ref 22–32)
CO2: 19 mmol/L — ABNORMAL LOW (ref 22–32)
Calcium: 7.8 mg/dL — ABNORMAL LOW (ref 8.9–10.3)
Calcium: 8.3 mg/dL — ABNORMAL LOW (ref 8.9–10.3)
Chloride: 109 mmol/L (ref 98–111)
Chloride: 108 mmol/L (ref 98–111)
Creatinine, Ser: 1.08 mg/dL (ref 0.61–1.24)
Creatinine, Ser: 0.91 mg/dL (ref 0.61–1.24)
GFR, Estimated: >60 mL/min (ref >60)
GFR, Estimated: >60 mL/min (ref >60)
Glucose, Bld: 156 mg/dL — ABNORMAL HIGH (ref 70–99)
Glucose, Bld: 109 mg/dL — ABNORMAL HIGH (ref 70–99)
Potassium: 3.7 mmol/L (ref 3.5–5.1)
Potassium: 4.2 mmol/L (ref 3.5–5.1)
Sodium: 139 mmol/L (ref 135–145)
Sodium: 139 mmol/L (ref 135–145)
Total Bilirubin: 0.6 mg/dL (ref 0.3–1.2)
Total Bilirubin: 0.8 mg/dL (ref 0.3–1.2)
Total Protein: 6 g/dL — ABNORMAL LOW (ref 6.5–8.1)
Total Protein: 6.4 g/dL — ABNORMAL LOW (ref 6.5–8.1)

## 2020-03-18 LAB — ABO/RH: ABO/RH(D): B POS

## 2020-03-18 LAB — CBC
HCT: 30.2 % — ABNORMAL LOW (ref 39.0–52.0)
HCT: 29.7 % — ABNORMAL LOW (ref 39.0–52.0)
Hemoglobin: 9.5 g/dL — ABNORMAL LOW (ref 13.0–17.0)
Hemoglobin: 9.5 g/dL — ABNORMAL LOW (ref 13.0–17.0)
MCH: 31.1 pg (ref 26.0–34.0)
MCH: 32.2 pg (ref 26.0–34.0)
MCHC: 31.5 g/dL (ref 30.0–36.0)
MCHC: 32 g/dL (ref 30.0–36.0)
MCV: 99 fL (ref 80.0–100.0)
MCV: 100.7 fL — ABNORMAL HIGH (ref 80.0–100.0)
Platelets: 147 10*3/uL — ABNORMAL LOW (ref 150–400)
Platelets: 114 10*3/uL — ABNORMAL LOW (ref 150–400)
RBC: 3.05 MIL/uL — ABNORMAL LOW (ref 4.22–5.81)
RBC: 2.95 MIL/uL — ABNORMAL LOW (ref 4.22–5.81)
RDW: 16.5 % — ABNORMAL HIGH (ref 11.5–15.5)
RDW: 16.4 % — ABNORMAL HIGH (ref 11.5–15.5)
WBC: 5 10*3/uL (ref 4.0–10.5)
WBC: 4.5 10*3/uL (ref 4.0–10.5)
nRBC: 0 % (ref 0.0–0.2)
nRBC: 0 % (ref 0.0–0.2)

## 2020-03-18 LAB — RESPIRATORY PANEL BY RT PCR (FLU A&B, COVID)
Influenza A by PCR: NEGATIVE
Influenza B by PCR: NEGATIVE
SARS Coronavirus 2 by RT PCR: NEGATIVE

## 2020-03-18 LAB — RETICULOCYTES
Immature Retic Fract: 17.5 % — ABNORMAL HIGH (ref 2.3–15.9)
RBC.: 2.96 MIL/uL — ABNORMAL LOW (ref 4.22–5.81)
Retic Count, Absolute: 48.5 10*3/uL (ref 19.0–186.0)
Retic Ct Pct: 1.6 % (ref 0.4–3.1)

## 2020-03-18 LAB — RAPID URINE DRUG SCREEN, HOSP PERFORMED
Amphetamines: NOT DETECTED
Barbiturates: NOT DETECTED
Benzodiazepines: NOT DETECTED
Cocaine: NOT DETECTED
Opiates: NOT DETECTED
Tetrahydrocannabinol: POSITIVE — AB

## 2020-03-18 LAB — URINALYSIS, ROUTINE W REFLEX MICROSCOPIC
Bilirubin Urine: NEGATIVE
Glucose, UA: 50 mg/dL — AB
Hgb urine dipstick: NEGATIVE
Ketones, ur: NEGATIVE mg/dL
Leukocytes,Ua: NEGATIVE
Nitrite: NEGATIVE
Protein, ur: NEGATIVE mg/dL
Specific Gravity, Urine: 1.02 (ref 1.005–1.030)
pH: 7 (ref 5.0–8.0)

## 2020-03-18 LAB — LACTATE DEHYDROGENASE: LDH: 113 U/L (ref 98–192)

## 2020-03-18 LAB — PROTIME-INR
INR: 1.3 — ABNORMAL HIGH (ref 0.8–1.2)
Prothrombin Time: 15.9 seconds — ABNORMAL HIGH (ref 11.4–15.2)

## 2020-03-18 LAB — LIPASE, BLOOD: Lipase: 74 U/L — ABNORMAL HIGH (ref 11–51)

## 2020-03-18 LAB — TROPONIN I (HIGH SENSITIVITY)
Troponin I (High Sensitivity): 4 ng/L (ref <18)
Troponin I (High Sensitivity): 4 ng/L (ref <18)

## 2020-03-18 LAB — IRON AND TIBC
Iron: 46 ug/dL (ref 45–182)
Saturation Ratios: 9 % — ABNORMAL LOW (ref 17.9–39.5)
TIBC: 490 ug/dL — ABNORMAL HIGH (ref 250–450)
UIBC: 444 ug/dL

## 2020-03-18 LAB — FOLATE: Folate: 3.4 ng/mL — ABNORMAL LOW (ref >5.9)

## 2020-03-18 LAB — POC OCCULT BLOOD, ED: Fecal Occult Bld: POSITIVE — AB

## 2020-03-18 LAB — HIV ANTIBODY (ROUTINE TESTING W REFLEX): HIV Screen 4th Generation wRfx: NONREACTIVE

## 2020-03-18 LAB — TYPE AND SCREEN
ABO/RH(D): B POS
Antibody Screen: NEGATIVE

## 2020-03-18 LAB — MAGNESIUM: Magnesium: 1.7 mg/dL (ref 1.7–2.4)

## 2020-03-18 LAB — FERRITIN: Ferritin: 10 ng/mL — ABNORMAL LOW (ref 24–336)

## 2020-03-18 LAB — D-DIMER, QUANTITATIVE: D-Dimer, Quant: 0.99 ug/mL-FEU — ABNORMAL HIGH (ref 0.00–0.50)

## 2020-03-18 LAB — PROCALCITONIN: Procalcitonin: <0.1 ng/mL

## 2020-03-18 LAB — VITAMIN B12: Vitamin B-12: 223 pg/mL (ref 180–914)

## 2020-03-18 LAB — PHOSPHORUS: Phosphorus: 2.2 mg/dL — ABNORMAL LOW (ref 2.5–4.6)

## 2020-03-18 LAB — ETHANOL: Alcohol, Ethyl (B): 64 mg/dL — ABNORMAL HIGH (ref <10)

## 2020-03-18 MED ORDER — MAGNESIUM SULFATE 2 GM/50ML IV SOLN
2.0000 g | Freq: Once | INTRAVENOUS | Status: AC
  Administered 2020-03-18: 2 g via INTRAVENOUS
  Filled 2020-03-18: qty 50

## 2020-03-18 MED ORDER — SODIUM CHLORIDE 0.9 % IV SOLN
500.0000 mg | Freq: Once | INTRAVENOUS | Status: DC
  Administered 2020-03-18: 500 mg via INTRAVENOUS
  Filled 2020-03-18: qty 500

## 2020-03-18 MED ORDER — THIAMINE HCL 100 MG/ML IJ SOLN: 100.0000 mg | Freq: Every day | INTRAMUSCULAR | Status: DC

## 2020-03-18 MED ORDER — HYDROCODONE-ACETAMINOPHEN 5-325 MG PO TABS
1.0000 | ORAL_TABLET | Freq: Once | ORAL | Status: AC
  Administered 2020-03-18: 1 via ORAL
  Filled 2020-03-18: qty 1

## 2020-03-18 MED ORDER — SODIUM CHLORIDE 0.9 % IV SOLN
1.0000 g | Freq: Once | INTRAVENOUS | Status: AC
  Administered 2020-03-18: 1 g via INTRAVENOUS
  Filled 2020-03-18: qty 10

## 2020-03-18 MED ORDER — SODIUM CHLORIDE 0.9 % IV SOLN
500.0000 mg | Freq: Once | INTRAVENOUS | Status: AC
  Administered 2020-03-18: 500 mg via INTRAVENOUS
  Filled 2020-03-18: qty 500

## 2020-03-18 MED ORDER — PANTOPRAZOLE SODIUM 40 MG IV SOLR
40.0000 mg | Freq: Once | INTRAVENOUS | Status: AC
  Administered 2020-03-18: 40 mg via INTRAVENOUS
  Filled 2020-03-18: qty 40

## 2020-03-18 MED ORDER — POTASSIUM PHOSPHATES 15 MMOLE/5ML IV SOLN: 15.0000 mmol | Freq: Once | INTRAVENOUS | Status: DC

## 2020-03-18 MED ORDER — ADULT MULTIVITAMIN W/MINERALS CH
1.0000 | ORAL_TABLET | Freq: Every day | ORAL | Status: DC
  Administered 2020-03-19 – 2020-03-21 (×3): 1 via ORAL
  Filled 2020-03-18 (×4): qty 1

## 2020-03-18 MED ORDER — SODIUM CHLORIDE 0.9 % IV BOLUS
1000.0000 mL | Freq: Once | INTRAVENOUS | Status: AC
  Administered 2020-03-18: 1000 mL via INTRAVENOUS

## 2020-03-18 MED ORDER — ACETAMINOPHEN 325 MG PO TABS
650.0000 mg | ORAL_TABLET | Freq: Four times a day (QID) | ORAL | Status: DC | PRN
  Administered 2020-03-18 – 2020-03-19 (×2): 650 mg via ORAL
  Filled 2020-03-18 (×2): qty 2

## 2020-03-18 MED ORDER — FOLIC ACID 1 MG PO TABS
1.0000 mg | ORAL_TABLET | Freq: Every day | ORAL | Status: DC
  Administered 2020-03-19 – 2020-03-21 (×3): 1 mg via ORAL
  Filled 2020-03-18 (×3): qty 1

## 2020-03-18 MED ORDER — SODIUM CHLORIDE 0.9 % IV SOLN
2.0000 g | INTRAVENOUS | Status: DC
  Administered 2020-03-19: 2 g via INTRAVENOUS
  Filled 2020-03-18 (×2): qty 20

## 2020-03-18 MED ORDER — NICOTINE 14 MG/24HR TD PT24
14.0000 mg | MEDICATED_PATCH | Freq: Every day | TRANSDERMAL | Status: DC
  Administered 2020-03-18 – 2020-03-21 (×4): 14 mg via TRANSDERMAL
  Filled 2020-03-18 (×4): qty 1

## 2020-03-18 MED ORDER — IOHEXOL 350 MG/ML SOLN
75.0000 mL | Freq: Once | INTRAVENOUS | Status: AC | PRN
  Administered 2020-03-18: 75 mL via INTRAVENOUS

## 2020-03-18 MED ORDER — AZITHROMYCIN 250 MG PO TABS
250.0000 mg | ORAL_TABLET | Freq: Every day | ORAL | Status: DC
  Administered 2020-03-19 – 2020-03-21 (×3): 250 mg via ORAL
  Filled 2020-03-18 (×3): qty 1

## 2020-03-18 MED ORDER — RAMELTEON 8 MG PO TABS
8.0000 mg | ORAL_TABLET | Freq: Every day | ORAL | Status: DC
  Administered 2020-03-19 – 2020-03-20 (×3): 8 mg via ORAL
  Filled 2020-03-18 (×4): qty 1

## 2020-03-18 MED ORDER — MORPHINE SULFATE (PF) 2 MG/ML IV SOLN
2.0000 mg | Freq: Once | INTRAVENOUS | Status: AC
  Administered 2020-03-18: 2 mg via INTRAVENOUS
  Filled 2020-03-18: qty 1

## 2020-03-18 MED ORDER — THIAMINE HCL 100 MG PO TABS
100.0000 mg | ORAL_TABLET | Freq: Every day | ORAL | Status: DC
  Administered 2020-03-19 – 2020-03-21 (×3): 100 mg via ORAL
  Filled 2020-03-18 (×4): qty 1

## 2020-03-18 MED ORDER — POTASSIUM PHOSPHATES 15 MMOLE/5ML IV SOLN
10.0000 mmol | Freq: Once | INTRAVENOUS | Status: AC
  Administered 2020-03-18: 10 mmol via INTRAVENOUS
  Filled 2020-03-18: qty 3.33

## 2020-03-18 NOTE — ED Notes (Signed)
PT request to use bedside commode

## 2020-03-18 NOTE — ED Notes (Signed)
Attempted to give reportx1 

## 2020-03-18 NOTE — ED Notes (Signed)
Pt had medium sized bowel movement with bright red blood in it.

## 2020-03-18 NOTE — ED Notes (Signed)
Pt rec'd 1 dose of Azithromax despite 2 doses showing on MAR.

## 2020-03-18 NOTE — ED Notes (Signed)
Pt ambulated to bathroom with no assitance. Steady gait. Pt provided with urinal for UA sample.

## 2020-03-18 NOTE — ED Notes (Signed)
Pt given water, per [facility] - RN.

## 2020-03-18 NOTE — H&P (Signed)
Date: 03/18/2020               Patient Name:  Vincent Watkins MRN: [id number]  DOB: 02/26/1971 Age / Sex: 49 y.o., male   PCP: Leonard Downing, MD         Medical Service: Internal Medicine Teaching Service         Attending Physician: Dr. Aldine Contes, MD    First Contact: Dr. Alfonse Spruce Pager: [phone]  Second Contact: Dr. Darrick Meigs Pager: [phone]       After Hours (After 5p/  First Contact Pager: [phone]  weekends / holidays): Second Contact Pager: [phone]   Chief Complaint: chest pain  History of Present Illness:  Vincent Watkins is a 49 yo M w/ PMH of DM, HTN presenting to [facility] with chest pain. He mentions that he was in his usual state of health until this morning when he had acute onset 10/10 pain of his left chest with radiation to the back. He describes the pain as intermittent, stabbing pain worsened with laying flat and cough. He denies any productive sputum or dyspnea. He mentions having similar symptoms about a month ago when he fell and hit his chest but otherwise had no similar symptoms in the past. He mentions endorsing subjective fevers, chills, and cold sweats. Denies any nausea, vomiting, diarrhea.   On review of systems, he mentions having some bright red blood per rectum which has been ongoing for months. He describes it as small volume intermittent red blood with stool. Denies any melena, hematemesis. States he had prior endoscopy and colonoscopy in his 108s to rule out Crohns and was told everything was negative. He was seen by Vincent Watkins for this issue earlier this year but has not followed up for colonoscopy yet.  Meds:  Current Meds  Medication Sig  . aspirin 81 MG chewable tablet Chew 81 mg by mouth daily.  . folic acid (FOLVITE) 1 MG tablet Take 1 mg by mouth daily.  Marland Kitchen lisinopril (ZESTRIL) 20 MG tablet Take 20 mg by mouth daily.   . metFORMIN (GLUCOPHAGE) 500 MG tablet Take 500 mg by mouth daily.  . Thiamine HCl (VITAMIN B1) 100 MG TABS Take 1 capsule  by mouth daily.   Allergies: Allergies as of 03/18/2020  . (No Known Allergies)   Past Medical History:  Diagnosis Date  . Hypertension   . Pre-diabetes   . Smoker    Family History:  Kidney cancer in brother. No other clinically relevant family hx.  Social History:  Smokes 2 packs a day for 30 years. Drinks about 2 pints of liquor daily. Denies any prior hx of withdrawal symptoms. Denies any drug use. Lives with daughter and son. Has been working for 30 years in [employer].  Review of Systems: A complete ROS was negative except as per HPI.   Physical Exam: Blood pressure (!) 135/95, pulse (!) 111, temperature 98.3 F (36.8 C), temperature source Oral, resp. rate 20, height 5\' 10"  (1.778 m), weight 88.5 kg, SpO2 98 %.  Gen: Well-developed, well nourished, acute distress in pain HEENT: NCAT head, hearing intact, EOMI, MMM Neck: supple, ROM intact, no JVD, no cervical adenopathy CV: RRR, S1, S2 normal, No rubs, no murmurs, no gallops Pulm: LLL dullness to percussion, + rales. Exquisitely tender to palpation on L flank. R side clear to ausculation Abd: Soft, BS+, NTND, No rebound, no guarding, no ascites Extm: ROM intact, Peripheral pulses intact, No peripheral edema Skin: Dry, Warm, normal turgor, no  rashes, lesions, wounds.  Neuro: AAOx3, no tremors  EKG: personally reviewed my interpretation is sinus tachycardia, no ischemic changes, similar to prior EKG  CXR: personally reviewed my interpretation is large left lower lobe pleural effusion  Assessment & Plan by Problem: Active Problems:   Pneumonia  Vincent Watkins is a 49 yo M w/ PMH of DM, HTN presenting to [facility] with chest pain likely due to pleuritis in setting of newly found left lung pleural effusion  Atypical Chest Pain 2/2 Pleuritis Presenting with atypical chest pain. EKG w/o ischemic changes, Trop negative x 2. D-dimer elevated at 0.99. Follow up CTA showing new onset large partially loculated L pleural effusion  with surrounding adenopathy. No pleural effusion from 02/2019 chest X-ray. No leukocytosis or productive sputum. Significant tobacco use history concerning for malignancy. Other differential includes bacterial / viral pneumonia or pneumoconiosis with sillica exposure. Will get further labs to assess. Received ceftriaxone / azithromycin in ED. - Consult to IR for thoracentesis - F/u procalcitonin - C/w ceftriaxone / azithromycin (can d/c pending above or gram stain from [facility]ora) - Trend cbc, continuous pulse ox - Need tobacco cessation counseling prior to d/c  Symptomatic Macrocytic anemia Bright Red Blood per Rectum likely 2/2 internal hemorrhoids Hgb 9.5, MCV 99, Baseline hemoglobin 14 per chart review. With history of significant alcohol use, likely due to folate/b12 deficiency. Does have episode of BRBPR but only small amount and likely due to internal hemorrhoids vs anorectal varices vs diverticular bleed. Scheduled for colonoscopy with [facility]. No obvious hemorrhoids or mass per ED provider rectal exam. Iron labs / b12 / folate pending. - F/u iron, tibc, ferritin, b12, folate - Monitor for now, can consult GI if worsening anemia or bleeding events - Trend cbc - F/u outpatient GI for colonoscopy  Alcohol Use Disorder Hepatic steatosis Has hx of daily 12 oz liquor use. On admission alcohol level 64. No current withdrawal sxs. CTA chest showing evidence of hepatic steatosis. Albumin 2.9. INR 1.3. Can f/u outpatient. - CIWA w/o ativan - Trend cmp  HTN On admission bp 80/60 after getting nitroglycerine for chest pain. Improved after small bolus. On lisinopril at home - Hold home lisinopril for now  DM On metformin at home. Mentions poor adherence. Admit blood glucose 156. - Monitor  DVT prophx: SCDs Diet: Clears Bowel: N/A Code: Full  Prior to Admission Living Arrangement: Home Anticipated Discharge Location: Home Barriers to Discharge: Medical tx  Dispo: Admit patient to  Inpatient with expected length of stay greater than 2 midnights.  Signed: Mosetta Anis, MD 03/18/2020, 12:46 PM  Pager: [phone]

## 2020-03-18 NOTE — Progress Notes (Signed)
MD on call paged that patient is requesting pain, sleep medication and a nicotine patch 2 pk daily smoker awaiting orders. Will continue to monitor. Arthor Captain LPN

## 2020-03-18 NOTE — ED Triage Notes (Signed)
Pt BIBA from home with chest pain, SOB, and dizziness that started at 0400. Radiation to lower back. Hx of HTN and prediabetes. Rec'd 1 nitro tab from EMS with relief of chest pain. Pt self administered 324mg  ASA at home. Pt became diaphroteic after nitro. Rec'd 453mL of NS. ST 130HR with EMS. Pt is short of breath and asking for more oxygen in triage. Skin warm and pale.

## 2020-03-18 NOTE — ED Provider Notes (Signed)
[id number] [facility] EMERGENCY DEPARTMENT Provider Note   CSN: [id number] Arrival date & time: 03/18/20  2751     History Chief Complaint  Patient presents with  . Chest Pain    Vincent Watkins is a 49 y.o. male.  Patient with hx htn, c/o left chest pain acute onset this AM, at rest. Pain acute onset, mod-severe, constant, radiating to neck, back, abdomen, dull. Denies hx same pain. +nausea, diaphoresis, sob. No other recent chest pain or discomfort. No exertional chest pain. No pleuritic pain. Also states bright red blood per rectum for past two weeks. States hx 'ulcer'. Also notes hx crohns. +epigastric pain. No hematemesis. EMS was called, gave NTG SL, chest pain mildly improved, but post NTG felt faint/lightheaded. No palpitations. No syncope. No fever or chills. Denies cough or uri symptoms. +smoker. +heavy etoh use. States father had cabg in his 91's.   The history is provided by the patient.  Chest Pain Associated symptoms: nausea and shortness of breath   Associated symptoms: no back pain, no cough, no fever, no headache, no numbness, no vomiting and no weakness        Past Medical History:  Diagnosis Date  . Hypertension   . Pre-diabetes   . Smoker     Patient Active Problem List   Diagnosis Date Noted  . Paresthesia 02/05/2016  . BMI 28.0-28.9,adult 10/25/2015  . Smoker     Past Surgical History:  Procedure Laterality Date  . FOOT FRACTURE SURGERY Left        Family History  Problem Relation Age of Onset  . Hyperlipidemia Mother   . Other Father        fall  . Cancer Brother        renal cell    Social History   Tobacco Use  . Smoking status: Current Every Day Smoker    Packs/day: 0.50    Years: 26.00    Pack years: 13.00  . Smokeless tobacco: Never Used  Substance Use Topics  . Alcohol use: Yes    Alcohol/week: 56.0 standard drinks    Types: 56 Standard drinks or equivalent per week    Comment: 8-9 beers per day  . Drug use: Yes     Frequency: 7.0 times per week    Types: Marijuana    Home Medications Prior to Admission medications   Medication Sig Start Date End Date Taking? Authorizing Provider  aspirin 81 MG chewable tablet Chew 81 mg by mouth daily. 12/26/18   [provider]  folic acid (FOLVITE) 1 MG tablet Take 1 mg by mouth daily. 01/26/19   [provider]  lisinopril (ZESTRIL) 20 MG tablet Take 20 mg by mouth every evening. 01/26/19   [provider]  metFORMIN (GLUCOPHAGE) 500 MG tablet Take 500 mg by mouth daily. 01/26/19   [provider]  Thiamine HCl (VITAMIN B1) 100 MG TABS Take 1 capsule by mouth daily. 01/26/19   [provider]    Allergies    Patient has no known allergies.  Review of Systems   Review of Systems  Constitutional: Negative for chills and fever.  HENT: Negative for sore throat.   Eyes: Negative for redness.  Respiratory: Positive for shortness of breath. Negative for cough.   Cardiovascular: Positive for chest pain.  Gastrointestinal: Positive for blood in stool and nausea. Negative for vomiting.  Endocrine: Negative for polyuria.  Genitourinary: Negative for dysuria and flank pain.  Musculoskeletal: Negative for back pain and neck pain.  Skin: Negative for rash.  Neurological: Negative for speech difficulty, weakness, numbness and headaches.  Hematological: Does not bruise/bleed easily.  Psychiatric/Behavioral: Negative for confusion.    Physical Exam Updated Vital Signs BP (!) 79/52   Pulse 100   Temp 98.3 F (36.8 C) (Oral)   Resp 16   Ht 1.778 m (5\' 10" )   Wt 88.5 kg   SpO2 96%   BMI 27.98 kg/m   Physical Exam Vitals and nursing note reviewed.  Constitutional:      Appearance: Normal appearance. He is well-developed.  HENT:     Head: Atraumatic.     Nose: Nose normal.     Mouth/Throat:     Mouth: Mucous membranes are moist.     Pharynx: Oropharynx is clear.  Eyes:     General: No scleral icterus.     Conjunctiva/sclera: Conjunctivae normal.     Pupils: Pupils are equal, round, and reactive to light.  Neck:     Trachea: No tracheal deviation.  Cardiovascular:     Rate and Rhythm: Regular rhythm. Tachycardia present.     Pulses: Normal pulses.     Heart sounds: Normal heart sounds. No murmur heard.  No friction rub. No gallop.   Pulmonary:     Effort: Pulmonary effort is normal. No accessory muscle usage or respiratory distress.     Breath sounds: Normal breath sounds.  Chest:     Chest wall: No tenderness.  Abdominal:     General: Bowel sounds are normal. There is no distension.     Palpations: Abdomen is soft. There is no mass.     Tenderness: There is no abdominal tenderness. There is no guarding or rebound.     Hernia: No hernia is present.  Genitourinary:    Comments: No cva tenderness. Yellowish/light brown stool, heme positive. No mass felt. No gross blood. No melena.  Musculoskeletal:        General: No swelling or tenderness.     Cervical back: Normal range of motion and neck supple. No rigidity.     Right lower leg: No edema.     Left lower leg: No edema.  Skin:    General: Skin is warm and dry.     Findings: No rash.  Neurological:     Mental Status: He is alert.     Comments: Alert, speech clear. Motor/sens grossly intact bil.   Psychiatric:        Mood and Affect: Mood normal.     ED Results / Procedures / Treatments   Labs (all labs ordered are listed, but only abnormal results are displayed) Results for orders placed or performed during the hospital encounter of 03/18/20  Respiratory Panel by RT PCR (Flu A&B, Covid) - Nasopharyngeal Swab   Specimen: Nasopharyngeal Swab  Result Value Ref Range   SARS Coronavirus 2 by RT PCR NEGATIVE NEGATIVE   Influenza A by PCR NEGATIVE NEGATIVE   Influenza B by PCR NEGATIVE NEGATIVE  CBC  Result Value Ref Range   WBC 5.0 4.0 - 10.5 K/uL   RBC 3.05 (L) 4.22 - 5.81 MIL/uL   Hemoglobin 9.5 (L) 13.0 - 17.0 g/dL   HCT  30.2 (L) 39 - 52 %   MCV 99.0 80.0 - 100.0 fL   MCH 31.1 26.0 - 34.0 pg   MCHC 31.5 30.0 - 36.0 g/dL   RDW 16.5 (H) 11.5 - 15.5 %   Platelets 147 (L) 150 - 400 K/uL   nRBC 0.0 0.0 -  0.2 %  Comprehensive metabolic panel  Result Value Ref Range   Sodium 139 135 - 145 mmol/L   Potassium 3.7 3.5 - 5.1 mmol/L   Chloride 109 98 - 111 mmol/L   CO2 20 (L) 22 - 32 mmol/L   Glucose, Bld 156 (H) 70 - 99 mg/dL   BUN 11 6 - 20 mg/dL   Creatinine, Ser 1.08 0.61 - 1.24 mg/dL   Calcium 7.8 (L) 8.9 - 10.3 mg/dL   Total Protein 6.0 (L) 6.5 - 8.1 g/dL   Albumin 2.9 (L) 3.5 - 5.0 g/dL   AST 31 15 - 41 U/L   ALT 22 0 - 44 U/L   Alkaline Phosphatase 49 38 - 126 U/L   Total Bilirubin 0.6 0.3 - 1.2 mg/dL   GFR, Estimated >60 >60 mL/min   Anion gap 10 5 - 15  Lipase, blood  Result Value Ref Range   Lipase 74 (H) 11 - 51 U/L  D-dimer, quantitative (not at [facility])  Result Value Ref Range   D-Dimer, Quant 0.99 (H) 0.00 - 0.50 ug/mL-FEU  Protime-INR  Result Value Ref Range   Prothrombin Time 15.9 (H) 11.4 - 15.2 seconds   INR 1.3 (H) 0.8 - 1.2  Ethanol  Result Value Ref Range   Alcohol, Ethyl (B) 64 (H) <10 mg/dL  POC occult blood, ED Provider will collect  Result Value Ref Range   Fecal Occult Bld POSITIVE (A) NEGATIVE  Type and screen  Result Value Ref Range   ABO/RH(D) B POS    Antibody Screen NEG    Sample Expiration      03/21/2020,2359 Performed at [facility], [address], [place], [place] [address]   ABO/Rh  Result Value Ref Range   ABO/RH(D)      B POS Performed at [facility] [address] [place], [place] [address]   Troponin I (High Sensitivity)  Result Value Ref Range   Troponin I (High Sensitivity) 4 <18 ng/L    EKG EKG Interpretation  Date/Time:  Monday March 18 2020 06:58:36 EST Ventricular Rate:  109 PR Interval:    QRS Duration: 96 QT Interval:  340 QTC Calculation: 458 R Axis:   78 Text Interpretation: Sinus tachycardia Nonspecific ST  abnormality Confirmed by Lajean Saver [phone]) on 03/18/2020 7:01:44 AM   Radiology CT Angio Chest PE W/Cm &/Or Wo Cm  Result Date: 03/18/2020 CLINICAL DATA:  Chest pain and shortness of breath. Positive D-dimer EXAM: CT ANGIOGRAPHY CHEST WITH CONTRAST TECHNIQUE: Multidetector CT imaging of the chest was performed using the standard protocol during bolus administration of intravenous contrast. Multiplanar CT image reconstructions and MIPs were obtained to evaluate the vascular anatomy. CONTRAST:  34mL OMNIPAQUE IOHEXOL 350 MG/ML SOLN COMPARISON:  Chest radiograph March 18, 2020 FINDINGS: Cardiovascular: There is no demonstrable pulmonary embolus. There is no thoracic aortic aneurysm or dissection. Visualized great vessels appear unremarkable. There are foci of coronary artery calcification. There is no pericardial effusion or pericardial thickening. Mediastinum/Nodes: Thyroid appears normal. There are scattered subcentimeter mediastinal lymph nodes. A lymph node in the right hilar region measures 1.0 x 1.0 cm. A lymph node to the left of the distal trachea measures 1.1 x 1.1 cm. No larger lymph nodes evident. No esophageal lesions are appreciable. Lungs/Pleura: There is a fairly sizable partially loculated right pleural effusion. There is atelectasis with airspace consolidation throughout much of the left lower lobe. There is atelectatic change in the inferior lingula. Scattered areas of atelectasis noted  elsewhere without consolidation. No pleural effusion on the right. Upper Abdomen: There is cholelithiasis. The gallbladder wall does not appear thickened. There is underlying hepatic steatosis. Liver contour is subtly nodular which potentially may indicate a degree of underlying hepatic cirrhosis. Musculoskeletal: There are no demonstrable blastic or lytic bone lesions. Schmorl's nodes are noted at multiple levels in the thoracic region. No evident chest wall lesions. Review of the MIP images confirms the  above findings. IMPRESSION: 1. No demonstrable pulmonary embolus. No thoracic aortic aneurysm or dissection. There are foci of coronary artery calcification. 2. Moderate partially loculated right pleural effusion. Airspace consolidation throughout much of the left lower lobe consistent with a combination of atelectasis and pneumonia. Areas of atelectatic change noted elsewhere, scattered. 3. Mild adenopathy in the right hilum and left paratracheal regions of uncertain etiology. Adenopathy of this nature may well have reactive etiology given the parenchymal lung changes noted. 4.  Cholelithiasis. 5. Suspect a degree of underlying hepatic cirrhosis. There is hepatic steatosis. Electronically Signed   By: Lowella Grip III M.D.   On: 03/18/2020 10:13   DG Chest Port 1 View  Result Date: 03/18/2020 CLINICAL DATA:  Chest pain. Additional history provided: Patient reports left upper chest and abdominal pain since this morning, history of hypertension, pre diabetes, smoker. EXAM: PORTABLE CHEST 1 VIEW COMPARISON:  Chest radiographs 02/20/2019. FINDINGS: Heart size within normal limits. Blunting of the left lateral costophrenic angle compatible with the presence of a small left pleural effusion. Additional ill-defined opacity within the left lung base may reflect atelectasis or pneumonia. The right lung is clear. No evidence of pneumothorax. IMPRESSION: Small left pleural effusion. Additional ill-defined opacity within the left lung base, which may reflect atelectasis or pneumonia. Electronically Signed   By: Kellie Simmering DO   On: 03/18/2020 07:54    Procedures Procedures (including critical care time)  Medications Ordered in ED Medications  sodium chloride 0.9 % bolus 1,000 mL (1,000 mLs Intravenous New Bag/Given 03/18/20 4431)    ED Course  I have reviewed the triage vital signs and the nursing notes.  Pertinent labs & imaging results that were available during my care of the patient were reviewed by  me and considered in my medical decision making (see chart for details).    MDM Rules/Calculators/A&P                          Iv ns bolus. Stat labs. Pcxr. Ecg. Continuous pulse ox and cardiac monitoring.   MDM Number of Diagnoses or Management Options   Amount and/or Complexity of Data Reviewed Clinical lab tests: ordered and reviewed Tests in the radiology section of CPT: ordered and reviewed Tests in the medicine section of CPT: ordered and reviewed Discussion of test results with the performing providers: yes Decide to obtain previous medical records or to obtain history from someone other than the patient: yes Obtain history from someone other than the patient: yes Review and summarize past medical records: yes Discuss the patient with other providers: yes Independent visualization of images, tracings, or specimens: yes  Risk of Complications, Morbidity, and/or Mortality Presenting problems: high Diagnostic procedures: high Management options: high   Reviewed nursing notes and prior charts for additional history.   Initial labs reviewed/interpreted by me - initial trop normal.   CXR reviewed/interpreted by me - ?infiltrate.  Additional labs reviewed/interpreted by me - ddimer elev. With elev ddimer, left infiltrate/eff, will get cta r/o PE.    CT  reviewed/intpreted by me - LLL pna. No PE. Incidental note gallstones - no upper abd pain or tenderness.   Additional labs reviewed/interpreted by me - hgb is lower than prior. Stool normal color, is heme pos, and pt reports brbpr for past two weeks. Anemia panel added.  Given pna on imaging, cxs added to workup, followed by iv abx.  It appears initial low bp likely due to having just received sl ntg - improved w ivf, and has not required (felt not due to sepsis).   For rectal bleeding, drop in hgb, acute chest pain, tx pna, etc. - will consult medicine for admission.     Final Clinical Impression(s) / ED  Diagnoses Final diagnoses:  None    Rx / DC Orders ED Discharge Orders    None       Lajean Saver, MD 03/18/20 1043

## 2020-03-19 ENCOUNTER — Inpatient Hospital Stay (HOSPITAL_COMMUNITY): Payer: BLUE CROSS/BLUE SHIELD

## 2020-03-19 DIAGNOSIS — K922 Gastrointestinal hemorrhage, unspecified: Secondary | ICD-10-CM

## 2020-03-19 DIAGNOSIS — K648 Other hemorrhoids: Secondary | ICD-10-CM

## 2020-03-19 DIAGNOSIS — R0789 Other chest pain: Secondary | ICD-10-CM

## 2020-03-19 DIAGNOSIS — W01190A Fall on same level from slipping, tripping and stumbling with subsequent striking against furniture, initial encounter: Secondary | ICD-10-CM

## 2020-03-19 DIAGNOSIS — J90 Pleural effusion, not elsewhere classified: Secondary | ICD-10-CM

## 2020-03-19 DIAGNOSIS — E119 Type 2 diabetes mellitus without complications: Secondary | ICD-10-CM

## 2020-03-19 DIAGNOSIS — S2232XA Fracture of one rib, left side, initial encounter for closed fracture: Secondary | ICD-10-CM

## 2020-03-19 HISTORY — PX: IR THORACENTESIS ASP PLEURAL SPACE W/IMG GUIDE: IMG5380

## 2020-03-19 LAB — PHOSPHORUS: Phosphorus: 3 mg/dL (ref 2.5–4.6)

## 2020-03-19 LAB — COMPREHENSIVE METABOLIC PANEL
ALT: 22 U/L (ref 0–44)
AST: 35 U/L (ref 15–41)
Albumin: 3.1 g/dL — ABNORMAL LOW (ref 3.5–5.0)
Alkaline Phosphatase: 41 U/L (ref 38–126)
Anion gap: 9 (ref 5–15)
BUN: 11 mg/dL (ref 6–20)
CO2: 20 mmol/L — ABNORMAL LOW (ref 22–32)
Calcium: 8.4 mg/dL — ABNORMAL LOW (ref 8.9–10.3)
Chloride: 106 mmol/L (ref 98–111)
Creatinine, Ser: 0.97 mg/dL (ref 0.61–1.24)
GFR, Estimated: >60 mL/min (ref >60)
Glucose, Bld: 103 mg/dL — ABNORMAL HIGH (ref 70–99)
Potassium: 4.4 mmol/L (ref 3.5–5.1)
Sodium: 135 mmol/L (ref 135–145)
Total Bilirubin: 1.4 mg/dL — ABNORMAL HIGH (ref 0.3–1.2)
Total Protein: 6.2 g/dL — ABNORMAL LOW (ref 6.5–8.1)

## 2020-03-19 LAB — BODY FLUID CELL COUNT WITH DIFFERENTIAL
Eos, Fluid: 4 %
Lymphs, Fluid: 30 %
Monocyte-Macrophage-Serous Fluid: 12 % — ABNORMAL LOW (ref 50–90)
Neutrophil Count, Fluid: 54 % — ABNORMAL HIGH (ref 0–25)
Total Nucleated Cell Count, Fluid: 533 cu mm (ref 0–1000)

## 2020-03-19 LAB — GRAM STAIN

## 2020-03-19 LAB — CBC
HCT: 26.4 % — ABNORMAL LOW (ref 39.0–52.0)
HCT: 26.1 % — ABNORMAL LOW (ref 39.0–52.0)
Hemoglobin: 8.5 g/dL — ABNORMAL LOW (ref 13.0–17.0)
Hemoglobin: 8.5 g/dL — ABNORMAL LOW (ref 13.0–17.0)
MCH: 31.3 pg (ref 26.0–34.0)
MCH: 31.5 pg (ref 26.0–34.0)
MCHC: 32.2 g/dL (ref 30.0–36.0)
MCHC: 32.6 g/dL (ref 30.0–36.0)
MCV: 97.1 fL (ref 80.0–100.0)
MCV: 96.7 fL (ref 80.0–100.0)
Platelets: 125 10*3/uL — ABNORMAL LOW (ref 150–400)
Platelets: 94 10*3/uL — ABNORMAL LOW (ref 150–400)
RBC: 2.72 MIL/uL — ABNORMAL LOW (ref 4.22–5.81)
RBC: 2.7 MIL/uL — ABNORMAL LOW (ref 4.22–5.81)
RDW: 16.2 % — ABNORMAL HIGH (ref 11.5–15.5)
RDW: 15.9 % — ABNORMAL HIGH (ref 11.5–15.5)
WBC: 3.6 10*3/uL — ABNORMAL LOW (ref 4.0–10.5)
WBC: 3.1 10*3/uL — ABNORMAL LOW (ref 4.0–10.5)
nRBC: 0 % (ref 0.0–0.2)
nRBC: 0 % (ref 0.0–0.2)

## 2020-03-19 LAB — LACTATE DEHYDROGENASE: LDH: 108 U/L (ref 98–192)

## 2020-03-19 LAB — PROTEIN, PLEURAL OR PERITONEAL FLUID: Total protein, fluid: 4.6 g/dL

## 2020-03-19 LAB — PROTEIN, TOTAL: Total Protein: 6.6 g/dL (ref 6.5–8.1)

## 2020-03-19 LAB — LACTATE DEHYDROGENASE, PLEURAL OR PERITONEAL FLUID: LD, Fluid: 144 U/L — ABNORMAL HIGH (ref 3–23)

## 2020-03-19 LAB — MAGNESIUM: Magnesium: 2.1 mg/dL (ref 1.7–2.4)

## 2020-03-19 MED ORDER — FENTANYL CITRATE (PF) 100 MCG/2ML IJ SOLN
INTRAMUSCULAR | Status: AC
  Filled 2020-03-19: qty 2

## 2020-03-19 MED ORDER — OXYCODONE HCL 5 MG PO TABS
5.0000 mg | ORAL_TABLET | Freq: Three times a day (TID) | ORAL | Status: DC | PRN
  Administered 2020-03-20: 5 mg via ORAL
  Filled 2020-03-19: qty 1

## 2020-03-19 MED ORDER — MIDAZOLAM HCL 2 MG/2ML IJ SOLN
INTRAMUSCULAR | Status: AC
  Filled 2020-03-19: qty 2

## 2020-03-19 MED ORDER — TRAZODONE HCL 50 MG PO TABS
25.0000 mg | ORAL_TABLET | Freq: Once | ORAL | Status: AC
  Administered 2020-03-19: 25 mg via ORAL
  Filled 2020-03-19: qty 1

## 2020-03-19 MED ORDER — LIDOCAINE HCL 1 % IJ SOLN
INTRAMUSCULAR | Status: DC | PRN
  Administered 2020-03-19: 20 mL via INTRADERMAL

## 2020-03-19 MED ORDER — LIDOCAINE HCL 1 % IJ SOLN
INTRAMUSCULAR | Status: AC
  Filled 2020-03-19: qty 20

## 2020-03-19 NOTE — Procedures (Signed)
Ultrasound-guided diagnostic and therapeutic left sided  thoracentesis performed yielding 870 milliters of sanginous colored fluid. No immediate complications.   Diagnostic fluid was sent to the lab for further analysis. Follow-up chest x-ray pending. EBL is < 2 ml.

## 2020-03-19 NOTE — Progress Notes (Signed)
Subjective:   Hospital day: 1  Overnight event: No  This morning, patient reports that he did not sleep well throughout the night. He reports that he has pain in the left side of his chest with an associated popping sensation if he is to roll onto his side.  Pain is controlled with Tylenol.  Objective:  Vital signs in last 24 hours: Vitals:   03/19/20 0131 03/19/20 0513 03/19/20 1033 03/19/20 1057  BP: 122/84 134/68 (!) 149/90 (!) 147/95  Pulse: (!) 102 89 86   Resp: 18 18 18    Temp: 98.4 F (36.9 C) 98.2 F (36.8 C) 97.9 F (36.6 C)   TempSrc: Oral Oral Oral   SpO2: 95% 97% 100%   Weight:      Height:        Physical Exam  Physical Exam Constitutional:      General: He is not in acute distress.    Appearance: Normal appearance.  HENT:     Head: Normocephalic.  Eyes:     General:        Right eye: No discharge.        Left eye: No discharge.  Cardiovascular:     Rate and Rhythm: Normal rate and regular rhythm.     Heart sounds: Normal heart sounds.  Pulmonary:     Effort: No respiratory distress.     Breath sounds: Normal breath sounds.  Chest:     Chest wall: Tenderness (Tenderness to palpation of left lower chest) present.  Abdominal:     General: Bowel sounds are normal.     Palpations: Abdomen is soft.  Musculoskeletal:     Cervical back: Normal range of motion.     Right lower leg: No edema.     Left lower leg: No edema.  Neurological:     Mental Status: He is alert.     Assessment/Plan: Vincent Watkins is a 49 y.o. male with hx of diabetes, hypertension presenting with chest pain, found to have a new onset of left loculated pleural effusion and left lower rib fracture.  Undergo thoracentesis today.  Principal Problem:   Pleural effusion Active Problems:   Pneumonia   Pleuritis   Left rib fracture   Diabetes ([place])  Atypical chest pain Left pleural effusion Left lower rib fracture Pneumonia Patient presents to the hospital for atypical  chest pain.  He had a fall and hit the coffee able on the left side a month ago.  His pain went away but came back a few days prior.  Chest x-ray showed small left pleural effusion with ill-defined opacity in the left lung base.  Also noticed a left lower rib fracture which I have confirmed with the radiologist on call.  CTA shows a left loculated pleural effusion with airspace consolidation throughout the left lower lobe concerning for pneumonia.  Differentials for his pleural effusion include hemothorax secondary to rib fracture, malignancy associated pleural effusion or pneumonia.  IR has performed a thoracentesis and collected a 70 mm of sanguinous colored fluid.  Pending cell counts, LDH, culture and Gram stain of pleural fluid.  If the pleural fluid fits the criteria for hemorrhagic effusion, will consult cardiothoracic surgery for possible chest tube placement.  We also treating possible pneumonia with ceftriaxone and azithromycin however procalcitonin < 0.1.  Patient also has been afebrile with no white count. -Pending pleural fluid study -Consider consult to cardiothoracic surgeon if hemorrhagic effusion -Continue ceftriaxone and azithromycin.  Monitor fever trend  Lower rib  fracture Trending down hemoglobin His baseline hemoglobin was 14 in 2020.  Hemoglobin trend 9.5-9.5-8.5.  Suspect that he is may be bleeding in the pleural space.  Repeat CBC at noon.  Pending pleural fluid studies.  His iron study is consistent with iron deficiency is anemia.  This could be related to his chronic GI bleed from hemorrhoids. -Repeat CBC -Transfuse if less than 7 -Consult cardiothoracic surgeon if hemorrhagic effusion -Holding Feraheme in the setting of infection  Macrocytic anemia Lower GI bleed likely internal hemorrhoids Patient complains of some bright red blood per rectum which has been going on for months.  Denies melena or hematemesis.  His story consistent with internal hemorrhoids.  Cannot exclude  anorectal varices or diverticular bleed.  Patient had a schedule follow-up colonoscopy with [facility]. -Monitor CBC -Transfuse if less than 7 -Continue folic acid -Follow-up outpatient GI for colonoscopy -Consider GI consultation if overt bleeding  Alcohol use Patient has history of 12 ounce of liquor use daily.  Alcohol level 64 on admission.  Patient is not actively withdrawing. -CIWA without Ativan -Thiamine and folic acid  Hypertension Holding blood pressure medication for now in the setting of possible ongoing bleeding  Diabetes Monitor CBG  Diet: Thin fluid IVF: N/A VTE: SCDs CODE: Full  Prior to Admission Living Arrangement: Home Anticipated Discharge Location: To be determined Barriers to Discharge: Pleural effusion Dispo: Anticipated discharge in approximately 1-2 day(s).   Gaylan Gerold, DO 03/19/2020, 11:41 AM Pager: [phone] After 5pm on weekdays and 1pm on weekends: On Call pager [phone]

## 2020-03-19 NOTE — Progress Notes (Signed)
  Date: 03/19/2020  Patient name: Vincent Watkins  Medical record number: [id number]  Date of birth: 1971-04-10   I have seen and evaluated Vincent Watkins and discussed their care with the Residency Team. In brief, patient is a 49 year old male with a past medical history of type 2 diabetes, hypertension who presented to the ED with chest pain x1 day.  Patient states that approximately 1 month ago he fell and had left-sided chest pain but this pain resolved. On the day prior to his admission he developed recurrent chest pain which is 10 out of 10 in intensity, left-sided, stabbing in nature and intermittent which is worsened by coughing and laying on his left side. Patient did complain of subjective fevers and chills and diaphoresis at home. Patient also complained of some mild associated shortness of breath. No nausea or vomiting, no diarrhea, no palpitations, no headache, no blurry vision, no focal weakness, no tingling or numbness, no abdominal pain. Patient does have some episodes of bright red blood per rectum which has been ongoing for months. No melena or hematemesis.  Today, patient complains of persistent left-sided chest pain but improved since admission.  PMHx, Fam Hx, and/or Soc Hx : As per resident admit note  Vitals:   03/19/20 1033 03/19/20 1057  BP: (!) 149/90 (!) 147/95  Pulse: 86   Resp: 18   Temp: 97.9 F (36.6 C)   SpO2: 100%    General: Awake, alert, oriented x3, NAD CVS: Regular rate and rhythm, normal heart sounds Lungs: Decreased breath sounds over left lung Abdomen: Soft, nontender, nondistended, normoactive bowel sounds Extremities: No edema noted, nontender to palpation Psych: Normal mood and affect HEENT: Normocephalic, atraumatic Skin: Warm and dry  Assessment and Plan: I have seen and evaluated the patient as outlined above. I agree with the formulated Assessment and Plan as detailed in the residents' note, with the following changes:   1. Left-sided chest pain  secondary to left pleural effusion: -The etiology behind the patient's left-sided pleural effusion remains uncertain at this time. Patient was noted to have associated left-sided airspace disease on CT chest and may have an underlying pneumonia but procalcitonin was less than 0.1 which is not consistent with a bacterial infection. Patient was noted to have a left rib fracture on his chest x-ray on review by the team. He was also noted to have sanguinous fluid that was drained by IR today. It is possible the patient has a hemothorax resulting from his trauma. Patient also is a history of extensive smoking and may have an underlying malignancy causing his pleural effusion. -We will continue with ceftriaxone and azithromycin for now -We will continue to monitor CBC -IR performed a thoracentesis today and drained 870 cc of sanguinous fluid -We will follow-up fluid analysis to determine if it is an exudate or transudate but note states that they drained sanguinous fluid and this may be a hemothorax -If this is a suspected hemothorax would obtain CT surgery evaluation for possible chest tube placement -Patient also noted to be anemic with a hemoglobin now down to 8.5 which may be secondary to underlying hemothorax versus episodes of bright red blood per rectum. He is also noted to be iron deficient. We will monitor CBC closely and transfuse as necessary. We will also consider iron supplementation once infection is ruled out -No further work-up at this time Aldine Contes, MD 11/9/202111:48 AM

## 2020-03-20 LAB — CBC
HCT: 25.3 % — ABNORMAL LOW (ref 39.0–52.0)
Hemoglobin: 8.3 g/dL — ABNORMAL LOW (ref 13.0–17.0)
MCH: 31.9 pg (ref 26.0–34.0)
MCHC: 32.8 g/dL (ref 30.0–36.0)
MCV: 97.3 fL (ref 80.0–100.0)
Platelets: 89 10*3/uL — ABNORMAL LOW (ref 150–400)
RBC: 2.6 MIL/uL — ABNORMAL LOW (ref 4.22–5.81)
RDW: 15.9 % — ABNORMAL HIGH (ref 11.5–15.5)
WBC: 3.9 10*3/uL — ABNORMAL LOW (ref 4.0–10.5)
nRBC: 0 % (ref 0.0–0.2)

## 2020-03-20 LAB — BASIC METABOLIC PANEL
Anion gap: 7 (ref 5–15)
BUN: 9 mg/dL (ref 6–20)
CO2: 21 mmol/L — ABNORMAL LOW (ref 22–32)
Calcium: 8.5 mg/dL — ABNORMAL LOW (ref 8.9–10.3)
Chloride: 105 mmol/L (ref 98–111)
Creatinine, Ser: 0.96 mg/dL (ref 0.61–1.24)
GFR, Estimated: >60 mL/min (ref >60)
Glucose, Bld: 109 mg/dL — ABNORMAL HIGH (ref 70–99)
Potassium: 3.7 mmol/L (ref 3.5–5.1)
Sodium: 133 mmol/L — ABNORMAL LOW (ref 135–145)

## 2020-03-20 LAB — CYTOLOGY - NON PAP

## 2020-03-20 LAB — STREP PNEUMONIAE URINARY ANTIGEN: Strep Pneumo Urinary Antigen: NEGATIVE

## 2020-03-20 MED ORDER — SODIUM CHLORIDE 0.9 % IV SOLN
1.0000 g | INTRAVENOUS | Status: DC
  Administered 2020-03-20 – 2020-03-21 (×2): 1 g via INTRAVENOUS
  Filled 2020-03-20 (×2): qty 1

## 2020-03-20 NOTE — Discharge Summary (Addendum)
Name: Vincent Watkins MRN: [id number] DOB: [birth year] 49 y.o. PCP: Leonard Downing, MD  Date of Admission: 03/18/2020  6:52 AM Date of Discharge: 03/21/2020 Attending Physician: Aldine Contes, MD  Discharge Diagnosis: 1.  Left pleural effusion 2.  Left lower rib fracture 3.  Community acquired pneumonia 4.  Normocytic anemia  Discharge Medications: Allergies as of 03/21/2020   No Known Allergies     Medication List    STOP taking these medications   aspirin 81 MG chewable tablet     TAKE these medications   folic acid 1 MG tablet Commonly known as: FOLVITE Take 1 mg by mouth daily. Notes to patient: Next dose is due tomorrow at 10:00 AM   lisinopril 20 MG tablet Commonly known as: ZESTRIL Take 20 mg by mouth daily. Notes to patient: Resume to your regular schedule   metFORMIN 500 MG tablet Commonly known as: GLUCOPHAGE Take 500 mg by mouth daily. Notes to patient: Resume to your regular schedule   Vitamin B1 100 MG Tabs Take 1 capsule by mouth daily. Notes to patient: Next dose is due tomorrow at 10:00 AM       Disposition and follow-up:   Mr.Vincent Watkins was discharged from [facility] in Stable condition.  At the hospital follow up visit please address:  1.    Primary care physician Hospital follow-up in 1 week -Please repeat chest x-ray, make sure it shows the left side down to decubitus --Repeat CBC for anemia -Patient need a colonoscopy given his chronic GI bleed   2.  Labs / imaging needed at time of follow-up: Repeat chest x-ray and CBC  3.  Pending labs/ test needing follow-up: Pleural fluid culture  Follow-up Appointments:  Follow-up Information    Leonard Downing, MD. Schedule an appointment as soon as possible for a visit in 1 week(s).   Specialty: Family Medicine Contact information: [address] [place] [address] [place], [place]. Call.   Why: Call to  schedule primary care appointment in 1 week to repeat CXR Contact information: [place] [address] [phone]              Primary care Center by problem list: 1.  Left pleural effusion Left lower rib fracture Patient presents to the hospital for atypical chest pain. He had a fall and hit the coffee able on the left side a month ago. His pain went away but came back a few days prior.Chest x-ray showed small left pleural effusion with ill-defined opacity in the left lung base. Also noticed a left lower rib fracture which I have confirmed with theradiologist on call. CTA shows a left loculated pleural effusion with airspace consolidation throughout the left lower lobe concerning for pneumonia. Differentials for his pleural effusion include hemothorax secondary to rib fracture, malignancy associated pleural effusion or pneumonia. IR has performed a thoracentesis and collected 870 mm of sanguinous colored fluid.  Exudative per lights criteria.  Culture negative to date.  No organisms seen on Gram stain.  Cytology is negative for malignancy.  Given the bloody pleural fluid, cardiothoracic surgeon was consulted for possible hemothorax.  No chest tube with surgery indicated at this time per cardiothoracic surgery.  Repeat chest x-ray shows decreased left pleural effusion and left lower lobe airspace disease with no pneumothorax.  CT surgery recommended repeat chest x-ray in 1 week when follow-up with PCP.  Pneumonia CTA shows left loculated pleural effusion with airspace consolidation throughout the left lower lobe concerning for pneumonia.  Procalcitonin negative on admission.  Patient has been afebrile since admission.  No white blood count.  Ceftriaxone and azithromycin was started empirically and was continue for 3 days.  Repeat chest x-ray shows decrease of left lower lobe airspace disease.  Normocytic anemia Lower GI bleed  likely internal hemorrhoids Patient complains of some bright red blood per rectum which has been going on for months. Denies melena or hematemesis. His story consistent with internal hemorrhoids. Cannot exclude anorectal varices or diverticular bleed. Patient had a schedule follow-up colonoscopy with [facility].  Hemoglobin stable during this admission. F/u with PCP for repeat CBC. Can consider Feraheme outpatient.  Discharge Vitals:   BP 115/73 (BP Location: Left Arm)   Pulse 81   Temp 98.5 F (36.9 C) (Oral)   Resp 18   Ht 5\' 10"  (1.778 m)   Wt 88.5 kg   SpO2 97%   BMI 27.98 kg/m   Pertinent Labs, Studies, and Procedures:  CBC Latest Ref Rng & Units 03/21/2020 03/20/2020 03/19/2020  WBC 4.0 - 10.5 K/uL 4.1 3.9(L) 3.1(L)  Hemoglobin 13.0 - 17.0 g/dL 8.2(L) 8.3(L) 8.5(L)  Hematocrit 39 - 52 % 25.1(L) 25.3(L) 26.1(L)  Platelets 150 - 400 K/uL 97(L) 89(L) 94(L)   CMP Latest Ref Rng & Units 03/21/2020 03/20/2020 03/19/2020  Glucose 70 - 99 mg/dL 111(H) 109(H) -  BUN 6 - 20 mg/dL 9 9 -  Creatinine 0.61 - 1.24 mg/dL 0.96 0.96 -  Sodium 135 - 145 mmol/L 132(L) 133(L) -  Potassium 3.5 - 5.1 mmol/L 3.6 3.7 -  Chloride 98 - 111 mmol/L 103 105 -  CO2 22 - 32 mmol/L 22 21(L) -  Calcium 8.9 - 10.3 mg/dL 8.7(L) 8.5(L) -  Total Protein 6.5 - 8.1 g/dL - - 6.6  Total Bilirubin 0.3 - 1.2 mg/dL - - -  Alkaline Phos 38 - 126 U/L - - -  AST 15 - 41 U/L - - -  ALT 0 - 44 U/L - - -   Pleural fluid Presence of mesothelial cells LDH 144 Total protein 4.6 Color red Total nucleated cells 553 Lymphocytes 30 Eosinophils 4 Neutrophils 54 Monocyte 12 Cytology negative for malignancy Gram stain: No organism Culture negative up to date  DG Chest 1 View  Result Date: 03/19/2020 CLINICAL DATA:  Status post left thoracentesis. EXAM: CHEST  1 VIEW COMPARISON:  Chest radiograph and CTA 03/18/2020 FINDINGS: The cardiomediastinal silhouette is within normal limits. The left pleural effusion has  decreased in size following interval thoracentesis and is now small. There is improved aeration of the left lung base with mild residual airspace opacity. The right lung remains clear. No pneumothorax is identified. IMPRESSION: Decreased left pleural effusion with improved left basilar aeration following thoracentesis. No pneumothorax. Electronically Signed   By: Logan Bores M.D.   On: 03/19/2020 11:33   CT Angio Chest PE W/Cm &/Or Wo Cm  Addendum Date: 03/18/2020   ADDENDUM REPORT: 03/18/2020 13:02 ADDENDUM: The partially loculated pleural effusion is present on the left side, not on the right side. Airspace consolidation throughout the left lower lobe is correctly stated in the report. Electronically Signed   By: Lowella Grip III M.D.   On: 03/18/2020 13:02   Result Date: 03/18/2020 CLINICAL DATA:  Chest pain and shortness of breath. Positive D-dimer EXAM: CT ANGIOGRAPHY CHEST WITH CONTRAST TECHNIQUE: Multidetector CT imaging of the chest was performed using  the standard protocol during bolus administration of intravenous contrast. Multiplanar CT image reconstructions and MIPs were obtained to evaluate the vascular anatomy. CONTRAST:  24mL OMNIPAQUE IOHEXOL 350 MG/ML SOLN COMPARISON:  Chest radiograph March 18, 2020 FINDINGS: Cardiovascular: There is no demonstrable pulmonary embolus. There is no thoracic aortic aneurysm or dissection. Visualized great vessels appear unremarkable. There are foci of coronary artery calcification. There is no pericardial effusion or pericardial thickening. Mediastinum/Nodes: Thyroid appears normal. There are scattered subcentimeter mediastinal lymph nodes. A lymph node in the right hilar region measures 1.0 x 1.0 cm. A lymph node to the left of the distal trachea measures 1.1 x 1.1 cm. No larger lymph nodes evident. No esophageal lesions are appreciable. Lungs/Pleura: There is a fairly sizable partially loculated right pleural effusion. There is atelectasis with  airspace consolidation throughout much of the left lower lobe. There is atelectatic change in the inferior lingula. Scattered areas of atelectasis noted elsewhere without consolidation. No pleural effusion on the right. Upper Abdomen: There is cholelithiasis. The gallbladder wall does not appear thickened. There is underlying hepatic steatosis. Liver contour is subtly nodular which potentially may indicate a degree of underlying hepatic cirrhosis. Musculoskeletal: There are no demonstrable blastic or lytic bone lesions. Schmorl's nodes are noted at multiple levels in the thoracic region. No evident chest wall lesions. Review of the MIP images confirms the above findings. IMPRESSION: 1. No demonstrable pulmonary embolus. No thoracic aortic aneurysm or dissection. There are foci of coronary artery calcification. 2. Moderate partially loculated right pleural effusion. Airspace consolidation throughout much of the left lower lobe consistent with a combination of atelectasis and pneumonia. Areas of atelectatic change noted elsewhere, scattered. 3. Mild adenopathy in the right hilum and left paratracheal regions of uncertain etiology. Adenopathy of this nature may well have reactive etiology given the parenchymal lung changes noted. 4.  Cholelithiasis. 5. Suspect a degree of underlying hepatic cirrhosis. There is hepatic steatosis. Electronically Signed: By: Lowella Grip III M.D. On: 03/18/2020 10:13   DG Chest Port 1 View  Result Date: 03/18/2020 CLINICAL DATA:  Chest pain. Additional history provided: Patient reports left upper chest and abdominal pain since this morning, history of hypertension, pre diabetes, smoker. EXAM: PORTABLE CHEST 1 VIEW COMPARISON:  Chest radiographs 02/20/2019. FINDINGS: Heart size within normal limits. Blunting of the left lateral costophrenic angle compatible with the presence of a small left pleural effusion. Additional ill-defined opacity within the left lung base may reflect  atelectasis or pneumonia. The right lung is clear. No evidence of pneumothorax. IMPRESSION: Small left pleural effusion. Additional ill-defined opacity within the left lung base, which may reflect atelectasis or pneumonia. Electronically Signed   By: Kellie Simmering DO   On: 03/18/2020 07:54   IR THORACENTESIS ASP PLEURAL SPACE W/IMG GUIDE  Result Date: 03/19/2020 INDICATION: Patient history of diabetes and hypertension presenting [place] emergency department with chest pain found to have a left-sided pleural effusion. Request is for therapeutic and diagnostic thoracentesis EXAM: ULTRASOUND GUIDED THERAPEUTIC AND DIAGNOSTIC THORACENTESIS MEDICATIONS: Lidocaine 1% 10 mL COMPLICATIONS: None immediate. PROCEDURE: An ultrasound guided thoracentesis was thoroughly discussed with the patient and questions answered. The benefits, risks, alternatives and complications were also discussed. The patient understands and wishes to proceed with the procedure. Written consent was obtained. Ultrasound was performed to localize and mark an adequate pocket of fluid in the left chest. The area was then prepped and draped in the normal sterile fashion. 1% Lidocaine was used for local anesthesia. Under ultrasound guidance a 6  Fr Safe-T-Centesis catheter was introduced. Thoracentesis was performed. The catheter was removed and a dressing applied. Patient unable to tolerate additional fluid removal at this time. FINDINGS: A total of approximately 870 mL of serosanguineous fluid was removed. Samples were sent to the laboratory as requested by the clinical team. IMPRESSION: Successful ultrasound guided left-sided therapeutic and diagnostic thoracentesis yielding 870 mL of pleural fluid. Read by: Rushie Nyhan, NP Electronically Signed   By: Jacqulynn Cadet M.D.   On: 03/19/2020 12:26   Discharge Instructions: Discharge Instructions    Call MD for:  persistant nausea and vomiting   Complete by: As directed    Call MD for:   severe uncontrolled pain   Complete by: As directed    Diet - low sodium heart healthy   Complete by: As directed    Discharge instructions   Complete by: As directed    Mr. Olivero, it is a pleasure taking care of you during this admission.  You came into the hospital for chest pain, found to have a left lower rib fracture and fluid in your lungs.  Intervention radiologist has collected 870 cc of fluid from the lung.  Repeat chest x-ray today showed improvement.  -Please follow-up with your primary care physician of your choice ([facility] or [facility]) in 1 week to have a repeat chest x-ray and CBC. -You can take Tylenol or NSAIDs such as ibuprofen for pain as needed -Please stop taking home aspirin 81 mg due to your low blood counts  Take care   Increase activity slowly   Complete by: As directed       Signed: Gaylan Gerold, DO 03/21/2020, 11:33 AM   Pager: [phone]

## 2020-03-20 NOTE — Progress Notes (Addendum)
Subjective:   Hospital day: 2  Overnight event: No acute event overnight  This morning, patient reports that he had significant soreness on the left-side of his chest yesterday, however his pain is improved after removal of pleural fluid. He understands and agrees with the plan for cardiothoracic surgery evaluation later today. He has no further questions or concerns.  Objective:  Vital signs in last 24 hours: Vitals:   03/19/20 1033 03/19/20 1057 03/19/20 2138 03/20/20 0510  BP: (!) 149/90 (!) 147/95 118/71 128/82  Pulse: 86  90 86  Resp: 18  18 17   Temp: 97.9 F (36.6 C)  98.7 F (37.1 C) 98.5 F (36.9 C)  TempSrc: Oral  Oral Oral  SpO2: 100%  98% 99%  Weight:      Height:       CBC Latest Ref Rng & Units 03/20/2020 03/19/2020 03/19/2020  WBC 4.0 - 10.5 K/uL 3.9(L) 3.1(L) 3.6(L)  Hemoglobin 13.0 - 17.0 g/dL 8.3(L) 8.5(L) 8.5(L)  Hematocrit 39 - 52 % 25.3(L) 26.1(L) 26.4(L)  Platelets 150 - 400 K/uL 89(L) 94(L) 125(L)   CMP Latest Ref Rng & Units 03/20/2020 03/19/2020 03/19/2020  Glucose 70 - 99 mg/dL 109(H) - 103(H)  BUN 6 - 20 mg/dL 9 - 11  Creatinine 0.61 - 1.24 mg/dL 0.96 - 0.97  Sodium 135 - 145 mmol/L 133(L) - 135  Potassium 3.5 - 5.1 mmol/L 3.7 - 4.4  Chloride 98 - 111 mmol/L 105 - 106  CO2 22 - 32 mmol/L 21(L) - 20(L)  Calcium 8.9 - 10.3 mg/dL 8.5(L) - 8.4(L)  Total Protein 6.5 - 8.1 g/dL - 6.6 6.2(L)  Total Bilirubin 0.3 - 1.2 mg/dL - - 1.4(H)  Alkaline Phos 38 - 126 U/L - - 41  AST 15 - 41 U/L - - 35  ALT 0 - 44 U/L - - 22     Physical Exam  Physical Exam Constitutional:      General: He is not in acute distress.    Appearance: He is normal weight. He is not toxic-appearing.  HENT:     Head: Normocephalic.  Eyes:     General:        Right eye: No discharge.        Left eye: No discharge.  Cardiovascular:     Rate and Rhythm: Normal rate and regular rhythm.  Pulmonary:     Effort: No respiratory distress.     Comments: Decreased breath sounds  the left lower lung Chest:     Chest wall: Tenderness (Mild tenderness to palpation of posterior lower left ribs) present.  Abdominal:     General: Bowel sounds are normal.     Palpations: Abdomen is soft.  Musculoskeletal:     Cervical back: Normal range of motion.     Right lower leg: No edema.     Left lower leg: No edema.  Skin:    General: Skin is warm.  Neurological:     Mental Status: He is alert.  Psychiatric:        Mood and Affect: Mood normal.     Assessment/Plan: Vincent Watkins is a 49 y.o. male with hx of diabetes, hypertension presenting with chest pain, found to have a new onset of left loculated pleural effusion and left lower rib fracture.  Status post thoracentesis.  Principal Problem:   Pleural effusion Active Problems:   Pneumonia   Pleuritis   Left rib fracture   Diabetes ([place])  Atypical chest pain Left pleural effusion Left  lower rib fracture Patient presents to the hospital for atypical chest pain.  He had a fall and hit the coffee able on the left side a month ago.  His pain went away but came back a few days prior.  Chest x-ray showed small left pleural effusion with ill-defined opacity in the left lung base.  Also noticed a left lower rib fracture which I have confirmed with the radiologist on call.  CTA shows a left loculated pleural effusion with airspace consolidation throughout the left lower lobe concerning for pneumonia.  Differentials for his pleural effusion include hemothorax secondary to rib fracture, malignancy associated pleural effusion or pneumonia.  IR has performed a thoracentesis and collected 870 mm of sanguinous colored fluid.  Exudative per lights criteria.  Culture negative to date.  No organisms seen on Gram stain.  Pending cytology.  Given the bloody pleural fluid, cardiothoracic surgeon was consulted for possible hemothorax.  Appreciate CT surgery recommendation. -CT surgery recommendation: No surgery indicated at this time.  Repeat  PA/lateral chest x-ray tomorrow to follow-up on effusion.  Follow-up with outpatient PCP in 1 week to repeat another chest x-ray (left decubitus view) -Pending pleural fluid cytology -CBC in a.m.  Pneumonia CTA shows left loculated pleural effusion with airspace consolidation throughout the left lower lobe concerning for pneumonia.  Procalcitonin negative on admission.  Patient has been afebrile in the last 24 hours.  No white blood count.  Given the large quantity of pleural fluid, will continue ceftriaxone and azithromycin. -Continue ceftriaxone and azithromycin (day 2)  Normocytic anemia Lower GI bleed likely internal hemorrhoids Patient complains of some bright red blood per rectum which has been going on for months.  Denies melena or hematemesis.  His story consistent with internal hemorrhoids.  Cannot exclude anorectal varices or diverticular bleed.  Patient had a schedule follow-up colonoscopy with [facility].  Hemoglobin stable at 8.3. 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  Alcohol level 64 on admission.  Patient is not actively withdrawing. -CIWA without Ativan -Thiamine and folic acid  Hypertension Holding blood pressure medication for now.  Blood pressure is normal  Diabetes Monitor CBG  Diet: Thin fluid IVF: N/A VTE: SCDs CODE: Full  Prior to Admission Living Arrangement: Home Anticipated Discharge Location: Home Barriers to Discharge: Repeat chest x-ray Dispo: Anticipated discharge in approximately 1 day(s).   Gaylan Gerold, DO 03/20/2020, 6:35 AM Pager: [phone] After 5pm on weekdays and 1pm on weekends: On Call pager [phone]

## 2020-03-21 ENCOUNTER — Inpatient Hospital Stay (HOSPITAL_COMMUNITY): Payer: BLUE CROSS/BLUE SHIELD

## 2020-03-21 ENCOUNTER — Encounter (HOSPITAL_COMMUNITY): Payer: Self-pay

## 2020-03-21 LAB — CBC
HCT: 25.1 % — ABNORMAL LOW (ref 39.0–52.0)
Hemoglobin: 8.2 g/dL — ABNORMAL LOW (ref 13.0–17.0)
MCH: 31.4 pg (ref 26.0–34.0)
MCHC: 32.7 g/dL (ref 30.0–36.0)
MCV: 96.2 fL (ref 80.0–100.0)
Platelets: 97 10*3/uL — ABNORMAL LOW (ref 150–400)
RBC: 2.61 MIL/uL — ABNORMAL LOW (ref 4.22–5.81)
RDW: 15.8 % — ABNORMAL HIGH (ref 11.5–15.5)
WBC: 4.1 10*3/uL (ref 4.0–10.5)
nRBC: 0 % (ref 0.0–0.2)

## 2020-03-21 LAB — BASIC METABOLIC PANEL
Anion gap: 7 (ref 5–15)
BUN: 9 mg/dL (ref 6–20)
CO2: 22 mmol/L (ref 22–32)
Calcium: 8.7 mg/dL — ABNORMAL LOW (ref 8.9–10.3)
Chloride: 103 mmol/L (ref 98–111)
Creatinine, Ser: 0.96 mg/dL (ref 0.61–1.24)
GFR, Estimated: >60 mL/min (ref >60)
Glucose, Bld: 111 mg/dL — ABNORMAL HIGH (ref 70–99)
Potassium: 3.6 mmol/L (ref 3.5–5.1)
Sodium: 132 mmol/L — ABNORMAL LOW (ref 135–145)

## 2020-03-21 NOTE — Progress Notes (Signed)
Patient arrived back to room.

## 2020-03-21 NOTE — Progress Notes (Signed)
Patient taken down for x-ray.

## 2020-03-21 NOTE — Progress Notes (Signed)
Subjective:   Hospital day: 2  Overnight event: No acute event overnight  Patient is seen at bedside.  He appears comfortable in no acute distress.  He reports soreness of his left flank, worse with breathing or movement.  Pain is well controlled with Tylenol.    Objective:  Vital signs in last 24 hours: Vitals:   03/20/20 1420 03/20/20 2028 03/20/20 2158 03/21/20 0500  BP: 134/86 (!) 143/89 123/67 115/73  Pulse: 97 88 91 81  Resp: 18 17 17 18   Temp: 98.3 F (36.8 C) 98.6 F (37 C) 97.9 F (36.6 C) 98.5 F (36.9 C)  TempSrc: Oral Oral  Oral  SpO2: 100% 98% 99% 97%  Weight:      Height:       CBC Latest Ref Rng & Units 03/21/2020 03/20/2020 03/19/2020  WBC 4.0 - 10.5 K/uL 4.1 3.9(L) 3.1(L)  Hemoglobin 13.0 - 17.0 g/dL 8.2(L) 8.3(L) 8.5(L)  Hematocrit 39 - 52 % 25.1(L) 25.3(L) 26.1(L)  Platelets 150 - 400 K/uL 97(L) 89(L) 94(L)   CMP Latest Ref Rng & Units 03/21/2020 03/20/2020 03/19/2020  Glucose 70 - 99 mg/dL 111(H) 109(H) -  BUN 6 - 20 mg/dL 9 9 -  Creatinine 0.61 - 1.24 mg/dL 0.96 0.96 -  Sodium 135 - 145 mmol/L 132(L) 133(L) -  Potassium 3.5 - 5.1 mmol/L 3.6 3.7 -  Chloride 98 - 111 mmol/L 103 105 -  CO2 22 - 32 mmol/L 22 21(L) -  Calcium 8.9 - 10.3 mg/dL 8.7(L) 8.5(L) -  Total Protein 6.5 - 8.1 g/dL - - 6.6  Total Bilirubin 0.3 - 1.2 mg/dL - - -  Alkaline Phos 38 - 126 U/L - - -  AST 15 - 41 U/L - - -  ALT 0 - 44 U/L - - -     Physical Exam  Physical Exam Constitutional:      General: He is not in acute distress.    Appearance: He is not toxic-appearing.  HENT:     Head: Normocephalic.  Eyes:     General:        Right eye: No discharge.        Left eye: No discharge.  Cardiovascular:     Rate and Rhythm: Normal rate and regular rhythm.     Heart sounds: Normal heart sounds.  Pulmonary:     Effort: No respiratory distress.     Breath sounds: Normal breath sounds.  Chest:     Chest wall: Tenderness (Tenderness to palpation the left lower back)  present.  Musculoskeletal:     Right lower leg: No edema.     Left lower leg: No edema.  Neurological:     Mental Status: He is alert.  Psychiatric:        Mood and Affect: Mood normal.     Assessment/Plan: Maxie Better a 49 y.o.malewith hx ofdiabetes, hypertensionpresenting with chest pain, found to have a new onset of left loculated pleural effusion and left lower rib fracture. Status post thoracentesis.  Principal Problem:   Pleural effusion Active Problems:   Pneumonia   Pleuritis   Left rib fracture   Diabetes ([place])  Atypical chest pain Left pleural effusion Left lower rib fracture Patient presents to the hospital for atypical chest pain. He had a fall and hit the coffee able on the left side a month ago. His pain went away but came back a few days prior.Chest x-ray showed small left pleural effusion with ill-defined opacity in the left lung  base. Also noticed a left lower rib fracture which I have confirmed with theradiologist on call. CTA shows a left loculated pleural effusion with airspace consolidation throughout the left lower lobe concerning for pneumonia. Differentials for his pleural effusion include hemothorax secondary to rib fracture, malignancy associated pleural effusion or pneumonia. IR has performed a thoracentesis and collected 870 mm of sanguinous colored fluid.  Exudative per lights criteria.  Culture negative to date.  No organisms seen on Gram stain.  Cytology is negative for malignancy.  Given the bloody pleural fluid, cardiothoracic surgeon was consulted for possible hemothorax.   -CT surgery recommendation: No surgery indicated at this time.  Follow-up with outpatient PCP in 1 week to repeat another chest x-ray (left decubitus view) -Repeat chest x-ray this morning showed decrease of pleural effusion and airspace disease in the left lower lobe.  No pneumothorax.  Hemoglobin stable at 8.2.  Patient is stable for discharge.  He will follow-up  with PCP in 1 week to repeat chest x-ray. -Holding his home aspirin  Pneumonia CTA shows left loculated pleural effusion with airspace consolidation throughout the left lower lobe concerning for pneumonia.  Procalcitonin negative on admission.  Patient has been afebrile in the last 24 hours.  No white blood count.  Antibiotics with ceftriaxone azithromycin for 3 days.  Repeat chest x-ray shows improvement of airspace disease in the left lower lobe  Normocytic anemia Lower GI bleed likely internal hemorrhoids Patient complains of some bright red blood per rectum which has been going on for months. Denies melena or hematemesis. His story consistent with internal hemorrhoids. Cannot exclude anorectal varices or diverticular bleed. Patient had a schedule follow-up colonoscopy with [facility].  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 Alcohol level 64 on admission. Patient is not actively withdrawing. -CIWA without Ativan -Thiamine and folic acid  Hypertension Holding blood pressure medication for now.  Blood pressure is normal  Diabetes Monitor CBG  Diet: Thin fluid IVF: N/A VTE: SCDs CODE: Full  Prior to Admission Living Arrangement: Home Anticipated Discharge Location: Home Barriers to Discharge:  No Dispo: Anticipated discharge today  Gaylan Gerold, DO 03/21/2020, 11:01 AM Pager: [phone] After 5pm on weekdays and 1pm on weekends: On Call pager [phone]

## 2020-03-23 LAB — CULTURE, BLOOD (ROUTINE X 2)
Culture: NO GROWTH
Culture: NO GROWTH
Special Requests: ADEQUATE
Special Requests: ADEQUATE

## 2020-03-24 LAB — CULTURE, BODY FLUID W GRAM STAIN -BOTTLE: Culture: NO GROWTH

## 2020-09-20 IMAGING — DX DG CHEST 1V PORT
1 series · 1 of 1 positions shown · non-contrast
Comparison: 12/25/2018

CLINICAL DATA: Left side chest pain

EXAM:
PORTABLE CHEST 1 VIEW

[chest ap]
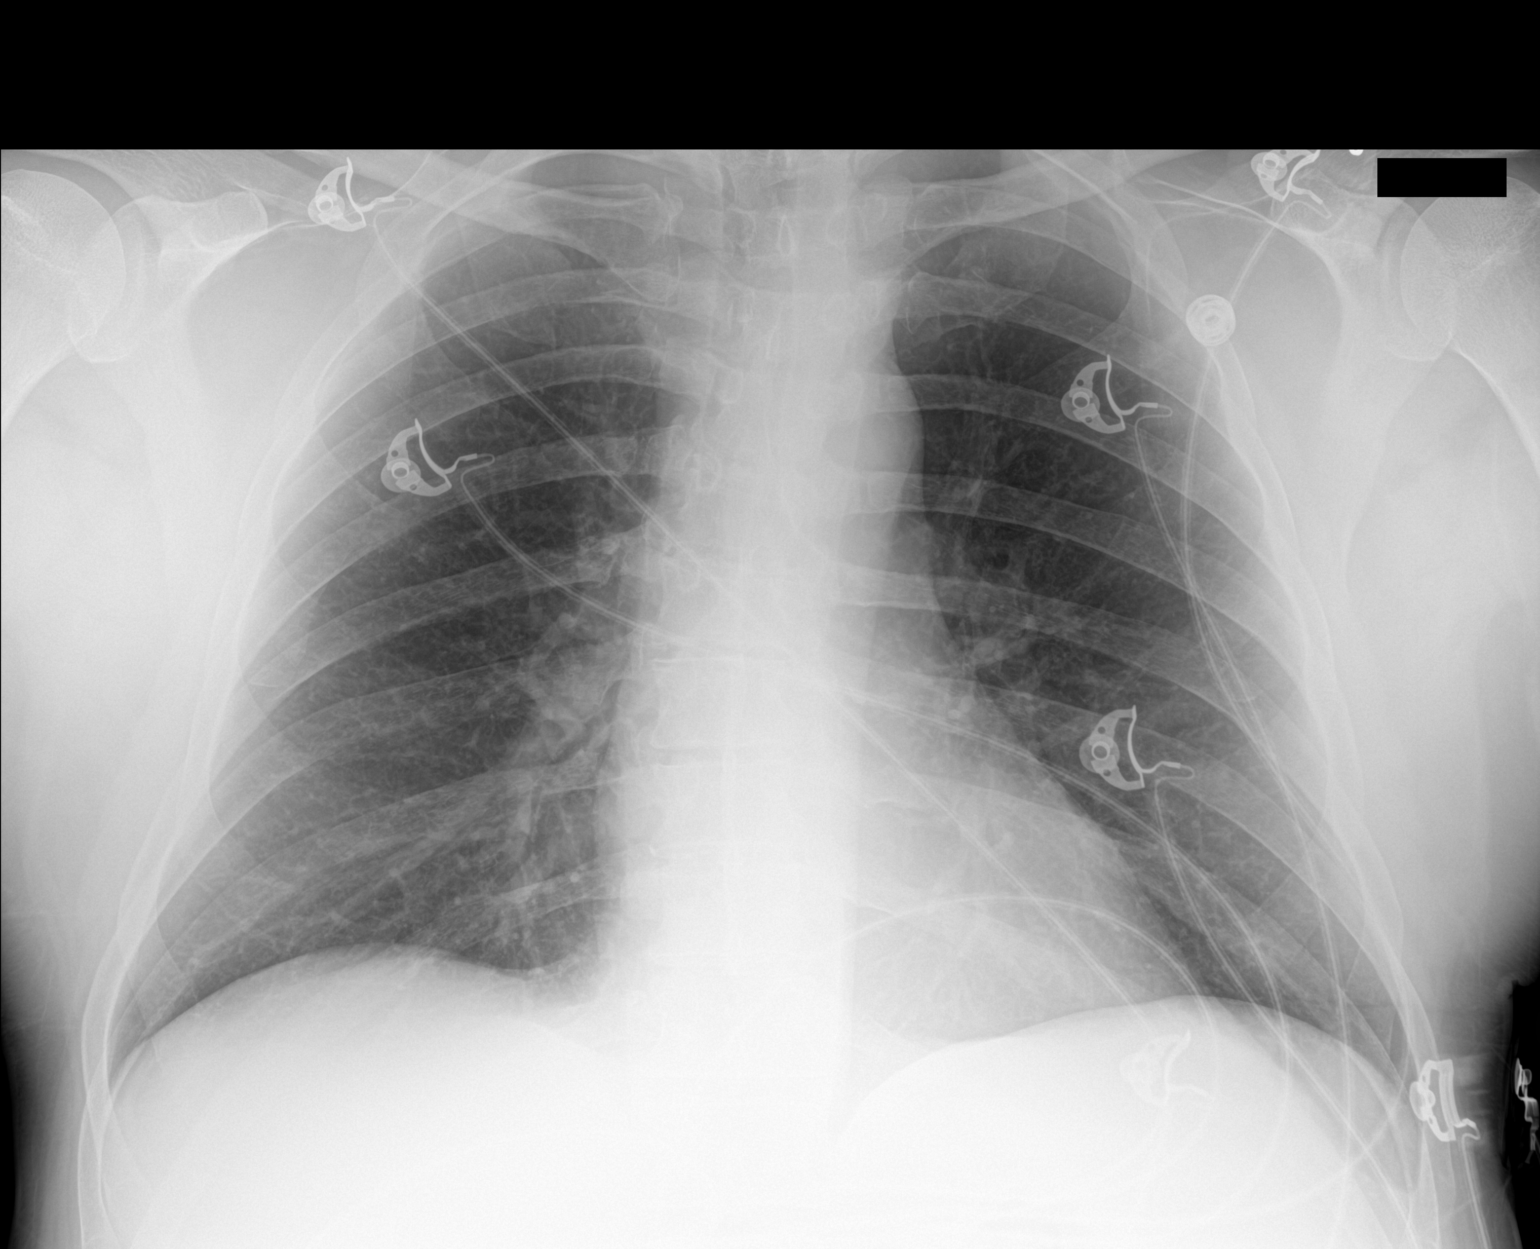

[1 of 1 positions shown; findings below may reference images not displayed]

FINDINGS: Heart and mediastinal contours are within normal limits. No focal
opacities or effusions. No acute bony abnormality.
IMPRESSION: No active disease.

## 2021-10-18 ENCOUNTER — Ambulatory Visit (HOSPITAL_COMMUNITY)
Admission: EM | Admit: 2021-10-18 | Discharge: 2021-10-18 | Disposition: A | Discharge status: Home / Self Care | Payer: BC Managed Care – PPO | Attending: Internal Medicine | Admitting: Internal Medicine

## 2021-10-18 ENCOUNTER — Encounter (HOSPITAL_COMMUNITY): Payer: Self-pay | Admitting: Emergency Medicine

## 2021-10-18 DIAGNOSIS — R3911 Hesitancy of micturition: Secondary | ICD-10-CM | POA: Insufficient documentation

## 2021-10-18 DIAGNOSIS — J301 Allergic rhinitis due to pollen: Secondary | ICD-10-CM | POA: Diagnosis not present

## 2021-10-18 DIAGNOSIS — Z113 Encounter for screening for infections with a predominantly sexual mode of transmission: Secondary | ICD-10-CM | POA: Insufficient documentation

## 2021-10-18 LAB — POCT URINALYSIS DIPSTICK, ED / UC
Bilirubin Urine: NEGATIVE
Glucose, UA: NEGATIVE mg/dL
Hgb urine dipstick: NEGATIVE
Ketones, ur: NEGATIVE mg/dL
Leukocytes,Ua: NEGATIVE
Nitrite: NEGATIVE
Protein, ur: NEGATIVE mg/dL
Specific Gravity, Urine: >=1.03 (ref 1.005–1.030)
Urobilinogen, UA: 0.2 mg/dL (ref 0.0–1.0)
pH: 5.5 (ref 5.0–8.0)

## 2021-10-18 IMAGING — DX DG CHEST 1V
1 series · 1 of 1 positions shown · non-contrast
Comparison: Chest radiograph and CTA 03/18/2020

CLINICAL DATA: Status post left thoracentesis.

EXAM:
CHEST  1 VIEW

[chest ap]
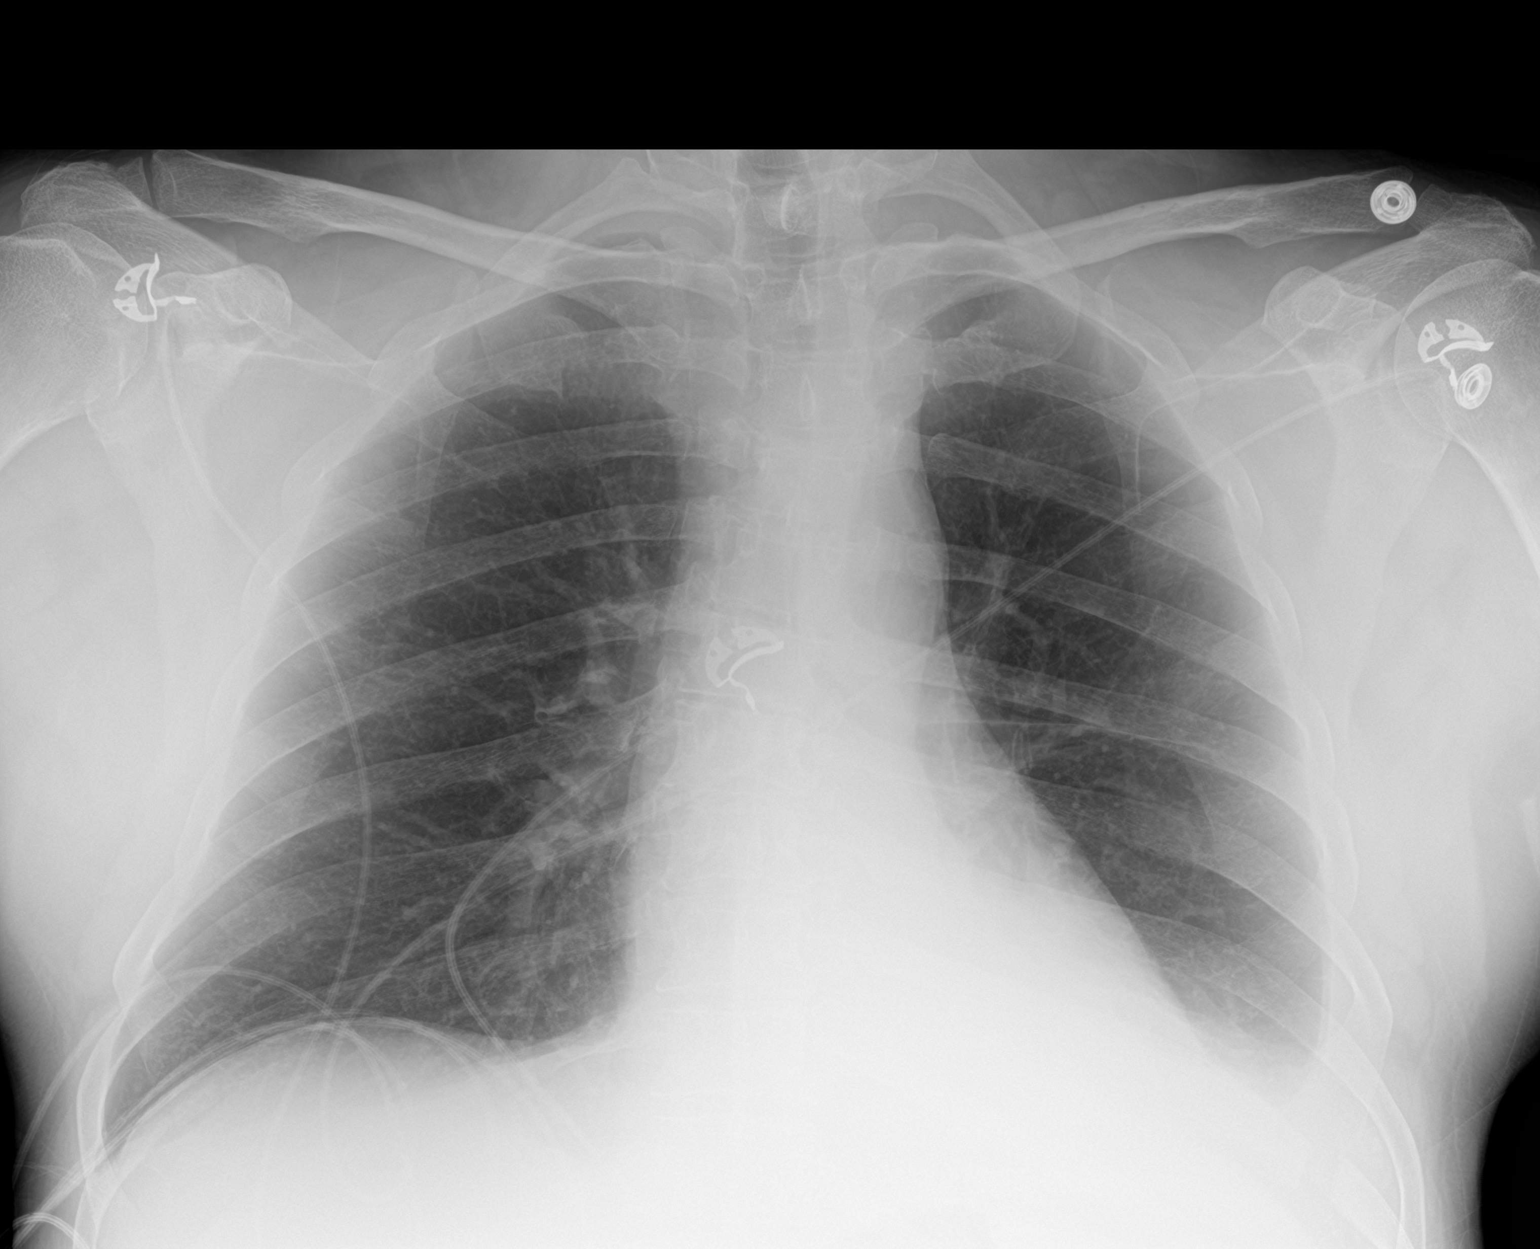

[1 of 1 positions shown; findings below may reference images not displayed]

FINDINGS: The cardiomediastinal silhouette is within normal limits. The left
pleural effusion has decreased in size following interval
thoracentesis and is now small. There is improved aeration of the
left lung base with mild residual airspace opacity. The right lung
remains clear. No pneumothorax is identified.
IMPRESSION: Decreased left pleural effusion with improved left basilar aeration
following thoracentesis. No pneumothorax.

## 2021-10-18 IMAGING — US IR THORACENTESIS ASP PLEURAL SPACE W/IMG GUIDE
1 series · 3 of 3 positions shown · non-contrast
Comparison: none

INDICATION: Patient history of diabetes and hypertension presenting [REDACTED] with chest pain found to have a left-sided
pleural effusion. Request is for therapeutic and diagnostic
thoracentesis

[Series 1: ir (id) (id)/(id)/(id) ir · 3 of 3 slices shown]
[im 1/3]
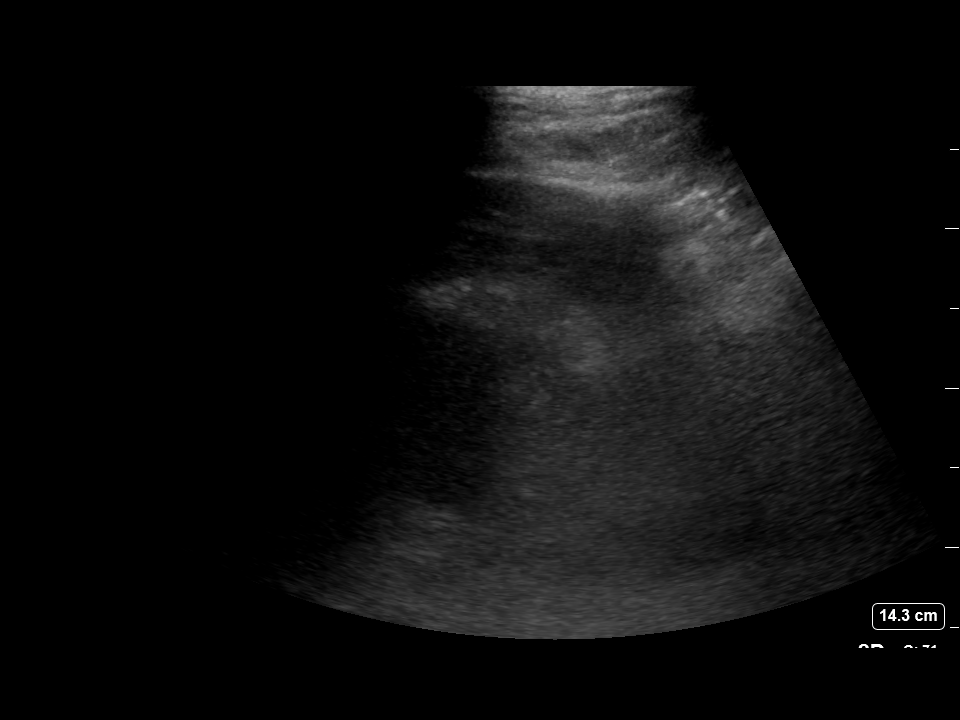
[im 2/3]
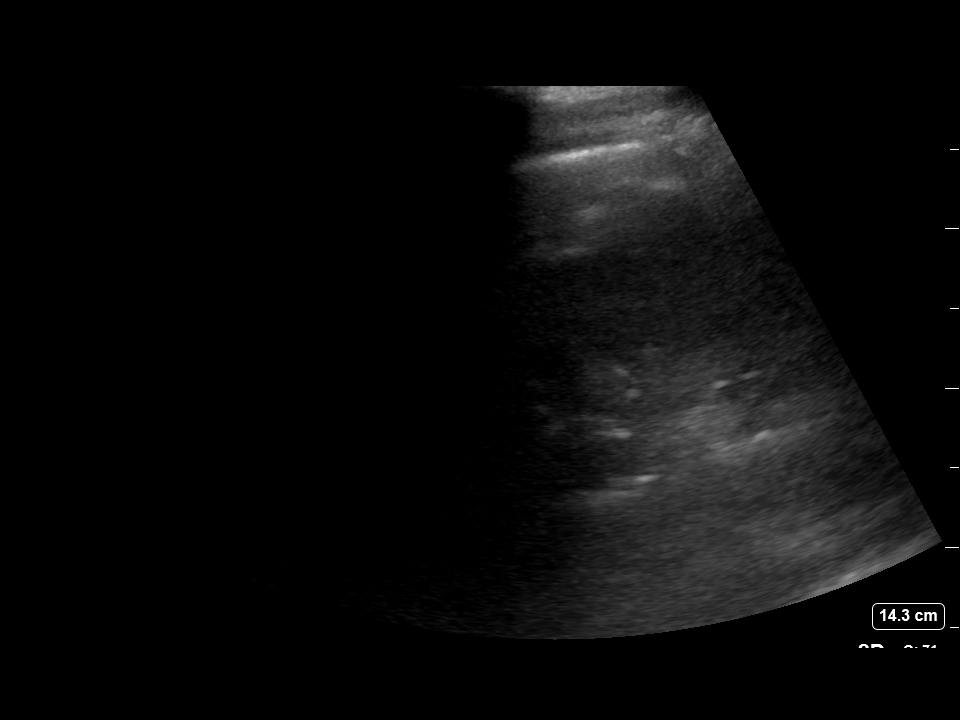
[im 3/3]
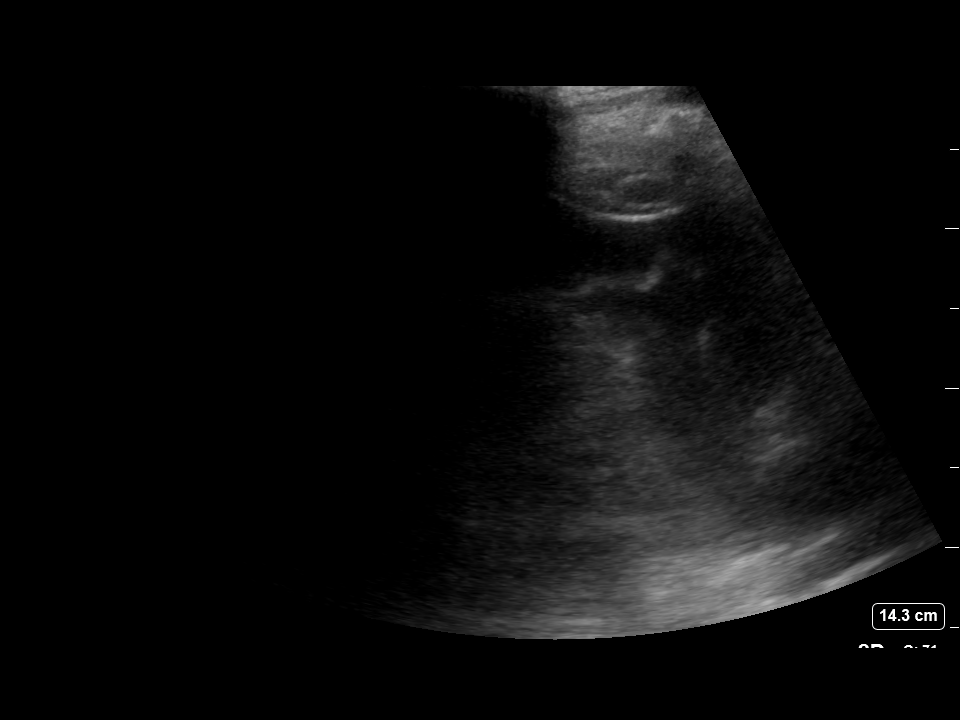

[3 of 3 positions shown; findings below may reference images not displayed]

EXAM:
ULTRASOUND GUIDED THERAPEUTIC AND DIAGNOSTIC THORACENTESIS

MEDICATIONS:
Lidocaine 1% 10 mL

COMPLICATIONS:
None immediate.

PROCEDURE:
An ultrasound guided thoracentesis was thoroughly discussed with the
patient and questions answered. The benefits, risks, alternatives
and complications were also discussed. The patient understands and
wishes to proceed with the procedure. Written consent was obtained.

Ultrasound was performed to localize and mark an adequate pocket of
fluid in the left chest. The area was then prepped and draped in the
normal sterile fashion. 1% Lidocaine was used for local anesthesia.
Under ultrasound guidance a 6 Fr Safe-T-Centesis catheter was
introduced. Thoracentesis was performed. The catheter was removed
and a dressing applied. Patient unable to tolerate additional fluid
removal at this time.
FINDINGS: A total of approximately 870 mL of serosanguineous fluid was
removed. Samples were sent to the laboratory as requested by the
clinical team.
IMPRESSION: Successful ultrasound guided left-sided therapeutic and diagnostic
thoracentesis yielding 870 mL of pleural fluid.

Read by: Anirban Jarrin, NP

## 2021-10-18 MED ORDER — CETIRIZINE HCL 10 MG PO TABS: 10.0000 mg | ORAL_TABLET | Freq: Every day | ORAL | 2 refills | Status: DC

## 2021-10-18 NOTE — ED Triage Notes (Addendum)
Reports progressing difficulties with starting urinary stream on-going over the last year. Noticed a new discharge with dysuria, unsure of when it started. Denies abdominal pain, fever, flank pain. Does report low back pain, but is unsure of whether its musculoskeletal related - notices it more when leaned over the sink. Also reports concern for possible STD

## 2021-10-18 NOTE — Discharge Instructions (Addendum)
You were seen in urgent care for urinary hesitancy.  Call [facility] on Monday and schedule an appointment for soon as possible to be seen for your symptoms.  Call your primary care provider on Monday to ask about lab work results.   If you go 24 hours without being able to urinate or have severe abdominal pain go to the emergency room for possible acute urinary retention.   Your STD testing will result in the next couple of days.  You will receive a phone call from [place] if your results are positive.  If your results are negative, you will not receive a phone call from [place].  Take cetirizine once daily for sore throat and postnasal drainage allergy symptoms.  Increase your water intake to at least 64 ounces of water per day to avoid dehydration.  If you develop any new or worsening symptoms or do not improve in the next 2 to 3 days, please return.  If your symptoms are severe, please go to the emergency room.  Follow-up with your primary care provider for further evaluation and management of your symptoms as well as ongoing wellness visits.  I hope you feel better!

## 2021-10-18 NOTE — ED Provider Notes (Signed)
[facility]    CSN: [id number] Arrival date & time: 10/18/21  1129      History   Chief Complaint Chief Complaint  Patient presents with   Urinary Retention    HPI Vincent Watkins is a 51 y.o. male.   Patient presents urgent care for evaluation of urinary hesitancy, urinary dribbling, difficulty starting urinary stream, incomplete bladder emptying, and nocturia for the last 8 months but has worsened over the last couple of weeks.  States that he wakes up at night 5 times on average to urinate.  He was seen by his primary care provider on Tuesday, June 6 and states that lab work was drawn at that appointment, but he does not know if a prostate specific antigen test was drawn to investigate for possible benign prostatic hypertrophy. Denies fever, chills, nausea, vomiting, abdominal pain, changes in stool habits, gross hematuria, and blood/mucus in stool.  He denies pain with defecation.  No penile discharge or rash.  Denies odor to his urine or abnormal color.  He works outside in [employer] and states that he does not drink very much water during the day.  He states that he feels like what ever water he does drink, he sweats it off.  He does report tobacco use.  Patient also reporting mild sore throat.  He was tested for group A strep at his primary care appointment 4 days ago, but has not received these results.  He is reporting mild nasal drainage and sneezing as well.  He attributes his symptoms to working outside surrounded by a lot of grass and plants.  He does not take any medications for allergic rhinitis.  Denies history of asthma/bronchitis.  Denies chest pain, shortness of breath, dizziness, and headache.      Past Medical History:  Diagnosis Date   Hypertension    Pre-diabetes    Smoker     Patient Active Problem List   Diagnosis Date Noted   Left rib fracture 03/19/2020   Pleural effusion 03/19/2020   Diabetes ([place]) 03/19/2020   Pneumonia 03/18/2020    Pleuritis 03/18/2020   Paresthesia 02/05/2016   BMI 28.0-28.9,adult 10/25/2015   Smoker     Past Surgical History:  Procedure Laterality Date   FOOT FRACTURE SURGERY Left    IR THORACENTESIS ASP PLEURAL SPACE W/IMG GUIDE  03/19/2020       Home Medications    Prior to Admission medications   Medication Sig Start Date End Date Taking? Authorizing Provider  cetirizine (ZYRTEC) 10 MG tablet Take 1 tablet (10 mg total) by mouth daily. 10/18/21  Yes Talbot Grumbling, FNP  folic acid (FOLVITE) 1 MG tablet Take 1 mg by mouth daily. 01/26/19   [provider]  lisinopril (ZESTRIL) 20 MG tablet Take 20 mg by mouth daily.  01/26/19   [provider]  metFORMIN (GLUCOPHAGE) 500 MG tablet Take 500 mg by mouth daily. 01/26/19   [provider]  Thiamine HCl (VITAMIN B1) 100 MG TABS Take 1 capsule by mouth daily. 01/26/19   [provider]    Family History Family History  Problem Relation Age of Onset   Hyperlipidemia Mother    Other Father        fall   Cancer Brother        renal cell    Social History Social History   Tobacco Use   Smoking status: Every Day    Packs/day: 0.50    Years: 26.00    Total pack  years: 13.00    Types: Cigarettes   Smokeless tobacco: Never  Vaping Use   Vaping Use: Never used  Substance Use Topics   Alcohol use: Yes    Alcohol/week: 56.0 standard drinks of alcohol    Types: 56 Standard drinks or equivalent per week    Comment: 8-9 beers per day   Drug use: Yes    Frequency: 7.0 times per week    Types: Marijuana     Allergies   Patient has no known allergies.   Review of Systems Review of Systems Per HPI  Physical Exam Triage Vital Signs ED Triage Vitals  Enc Vitals Group     BP 10/18/21 1212 134/80     Pulse Rate 10/18/21 1212 82     Resp 10/18/21 1212 16     Temp 10/18/21 1212 98 F (36.7 C)     Temp Source 10/18/21 1212 Oral     SpO2 10/18/21 1212 94 %     Weight --      Height --       Head Circumference --      Peak Flow --      Pain Score 10/18/21 1213 0     Pain Loc --      Pain Edu? --      Excl. in [place]? --    No data found.  Updated Vital Signs BP 134/80 (BP Location: Right Arm)   Pulse 82   Temp 98 F (36.7 C) (Oral)   Resp 16   SpO2 94%   Visual Acuity Right Eye Distance:   Left Eye Distance:   Bilateral Distance:    Right Eye Near:   Left Eye Near:    Bilateral Near:     Physical Exam Vitals and nursing note reviewed.  Constitutional:      General: He is not in acute distress.    Appearance: Normal appearance. He is well-developed. He is not ill-appearing.     Comments: Very pleasant patient sitting comfortably on exam able in no acute distress.   HENT:     Head: Normocephalic and atraumatic.     Right Ear: Tympanic membrane, ear canal and external ear normal.     Left Ear: Tympanic membrane, ear canal and external ear normal.     Nose: Rhinorrhea present.     Mouth/Throat:     Mouth: Mucous membranes are moist.     Pharynx: Posterior oropharyngeal erythema present.     Comments: Mild erythema to posterior oropharynx with small amount of clear postnasal drainage visualized. Airway intact and patent. Eyes:     General: Lids are normal. Vision grossly intact. Gaze aligned appropriately.     Extraocular Movements: Extraocular movements intact.     Conjunctiva/sclera: Conjunctivae normal.     Right eye: Right conjunctiva is not injected.     Left eye: Left conjunctiva is not injected.  Cardiovascular:     Rate and Rhythm: Normal rate and regular rhythm.     Heart sounds: Normal heart sounds, S1 normal and S2 normal. No murmur heard.    No friction rub. No gallop.  Pulmonary:     Effort: Pulmonary effort is normal. No respiratory distress.     Breath sounds: Normal breath sounds. No decreased air movement.  Abdominal:     General: Abdomen is flat. Bowel sounds are normal. There is no distension.     Palpations: Abdomen is soft.      Tenderness: There is no abdominal tenderness. There  is no right CVA tenderness or left CVA tenderness.  Musculoskeletal:        General: No swelling.     Cervical back: Neck supple.     Right lower leg: No edema.     Left lower leg: No edema.  Lymphadenopathy:     Cervical: No cervical adenopathy.  Skin:    General: Skin is warm and dry.     Capillary Refill: Capillary refill takes less than 2 seconds.     Findings: No rash.  Neurological:     General: No focal deficit present.     Mental Status: He is alert and oriented to person, place, and time. Mental status is at baseline.     Motor: No weakness.     Gait: Gait is intact. Gait normal.  Psychiatric:        Attention and Perception: Attention and perception normal.        Mood and Affect: Mood normal.        Speech: Speech normal.        Behavior: Behavior normal. Behavior is cooperative.        Thought Content: Thought content normal.        Cognition and Memory: Cognition and memory normal.        Judgment: Judgment normal.      UC Treatments / Results  Labs (all labs ordered are listed, but only abnormal results are displayed) Labs Reviewed  POCT URINALYSIS DIPSTICK, ED / UC  CYTOLOGY, (ORAL, ANAL, URETHRAL) ANCILLARY ONLY    EKG   Radiology No results found.  Procedures Procedures (including critical care time)  Medications Ordered in UC Medications - No data to display  Initial Impression / Assessment and Plan / UC Course  I have reviewed the triage vital signs and the nursing notes.  Pertinent labs & imaging results that were available during my care of the patient were reviewed by me and considered in my medical decision making (see chart for details).  Patient is a 51 year old male presenting to urgent care with urinary symptoms and seasonal allergy symptoms.  Symptoms are concerning for possible benign prostatic hypertrophy.  Low suspicion for acute prostatitis at this time as patient is afebrile and  there is no hematuria to urinalysis.  Urinalysis is negative for urinary tract infection and hematuria/glycosuria.  Urine specific gravity is elevated indicating possible dehydration.  Patient instructed to increase his water intake to at least 64 ounces of water per day.  He has been instructed to stop drinking water after 5 PM to avoid increased nocturia.  Walking referral to urology given today.  Patient encouraged to call his primary care provider for further evaluation and management of urinary symptoms in the meantime prior to being able to get an appointment with a urologist.   Low suspicion for acute urinary retention at this time as patient was able to void an adequate amount for urine sample urgent care and is non-tender to abdominal exam. Patient instructed that if he is not able to void for a 24-hour period, he needs to report directly to the emergency department for possible acute urinary retention.  Sore throat is likely related to allergic rhinitis etiology based on postnasal drainage visualized to posterior oropharynx on physical exam.  Plan to treat this with cetirizine once daily as patient is exposed to grass and pollen frequently while working outside in [employer].  He may take Tylenol over-the-counter as needed for sore throat.  Counseled patient regarding appropriate use of  medications and potential side effects for all medications recommended or prescribed today. Discussed red flag signs and symptoms of worsening condition,when to call the PCP office, return to urgent care, and when to seek higher level of care. Patient verbalizes understanding and agreement with plan. All questions answered. Patient discharged in stable condition.   Final Clinical Impressions(s) / UC Diagnoses   Final diagnoses:  Urinary hesitancy  Screening examination for sexually transmitted disease  Seasonal allergic rhinitis due to pollen     Discharge Instructions      You were seen in urgent  care for urinary hesitancy.  [facility] on Monday and schedule an appointment for soon as possible to be seen for your symptoms.  Call your primary care provider on Monday to ask about lab work results.   If you go 24 hours without being able to urinate or have severe abdominal pain go to the emergency room for possible acute urinary retention.   Your STD testing will result in the next couple of days.  You will receive a phone call from [place] if your results are positive.  If your results are negative, you will not receive a phone call from [place].  Take cetirizine once daily for sore throat and postnasal drainage allergy symptoms.  Increase your water intake to at least 64 ounces of water per day to avoid dehydration.  If you develop any new or worsening symptoms or do not improve in the next 2 to 3 days, please return.  If your symptoms are severe, please go to the emergency room.  Follow-up with your primary care provider for further evaluation and management of your symptoms as well as ongoing wellness visits.  I hope you feel better!    ED Prescriptions     Medication Sig Dispense Auth. Provider   cetirizine (ZYRTEC) 10 MG tablet Take 1 tablet (10 mg total) by mouth daily. 30 tablet Talbot Grumbling, FNP      PDMP not reviewed this encounter.   Talbot Grumbling, [place] 10/18/21 1353

## 2021-10-20 LAB — CYTOLOGY, (ORAL, ANAL, URETHRAL) ANCILLARY ONLY
Chlamydia: NEGATIVE
Comment: NEGATIVE
Comment: NEGATIVE
Comment: NORMAL
Neisseria Gonorrhea: NEGATIVE
Trichomonas: POSITIVE — AB

## 2021-10-20 IMAGING — CR DG CHEST 2V
2 series · 2 of 2 positions shown · non-contrast
Comparison: 03/19/2020

CLINICAL DATA: Pleural effusion.  Recent thoracentesis.

EXAM:
CHEST - 2 VIEW

[chest pa]
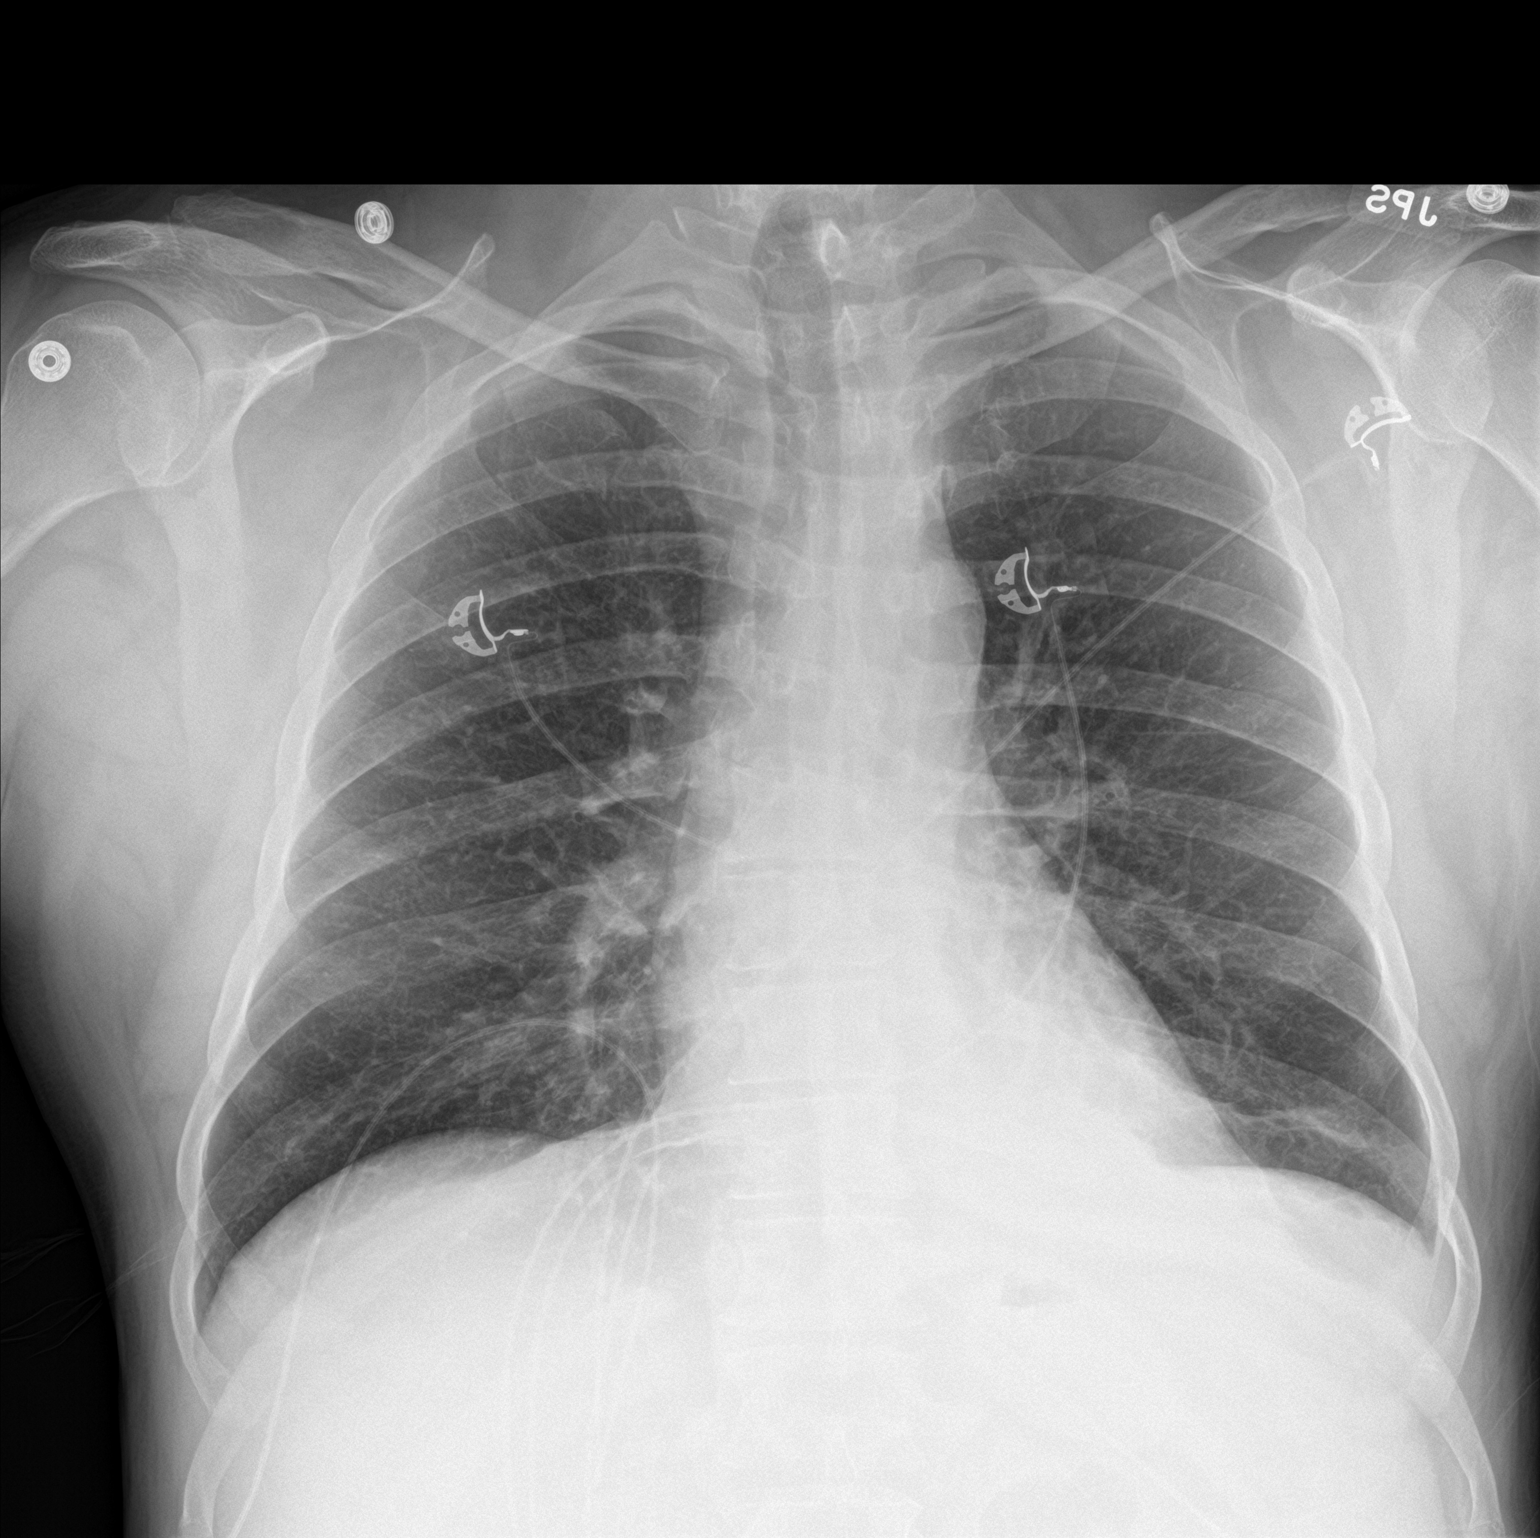

[chest lat]
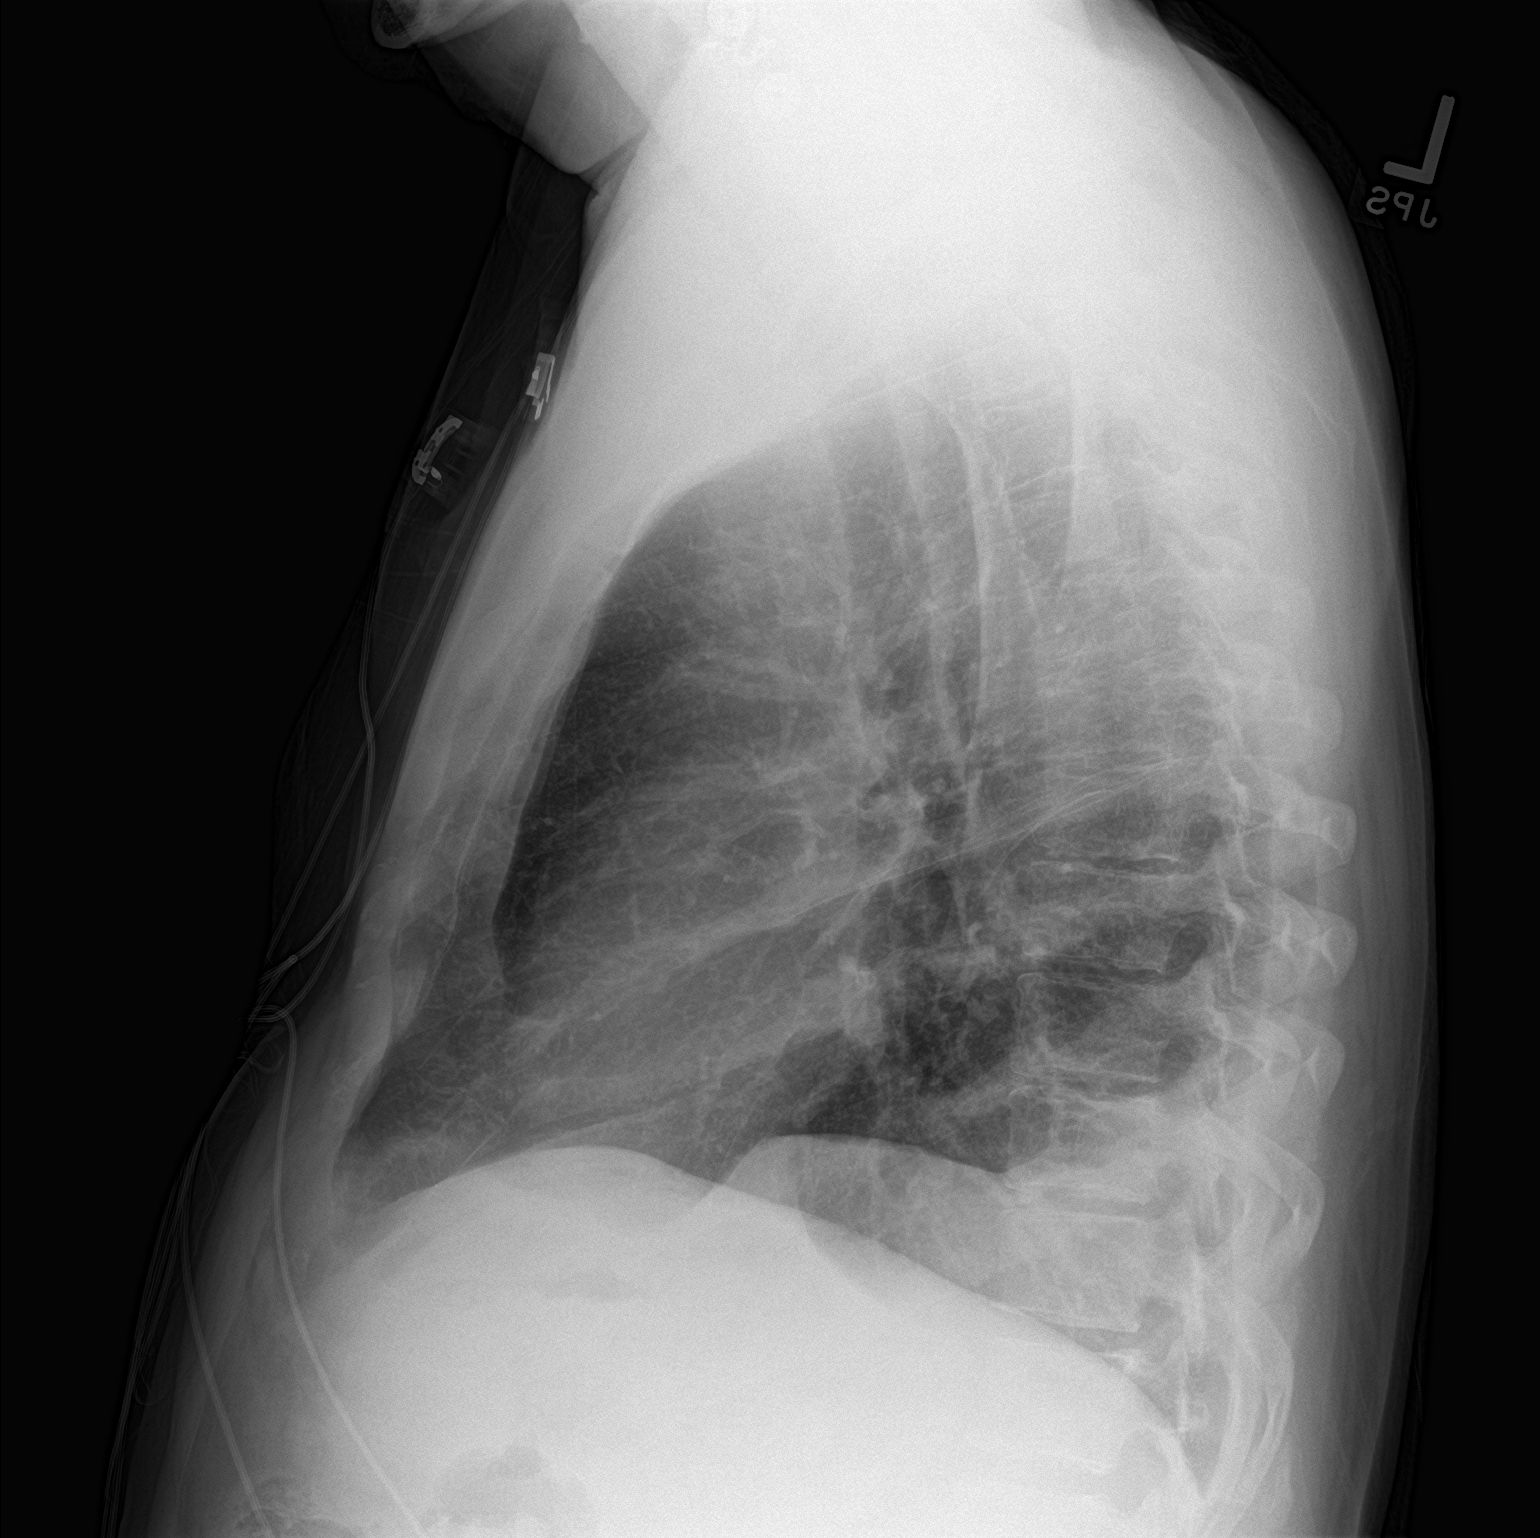

[2 of 2 positions shown; findings below may reference images not displayed]

FINDINGS: Decreased left pleural effusion. No pneumothorax. Left lower lobe
airspace disease mildly improved.

Right lung clear.  Negative for heart failure.  No right effusion.
IMPRESSION: Decreased left pleural effusion and left lower lobe airspace
disease. No pneumothorax.

## 2021-10-21 ENCOUNTER — Telehealth (HOSPITAL_COMMUNITY): Payer: Self-pay | Admitting: Emergency Medicine

## 2021-10-21 MED ORDER — METRONIDAZOLE 500 MG PO TABS: 2000.0000 mg | ORAL_TABLET | Freq: Once | ORAL | 0 refills | Status: AC

## 2022-01-16 ENCOUNTER — Other Ambulatory Visit: Payer: Self-pay

## 2022-01-16 ENCOUNTER — Emergency Department (HOSPITAL_BASED_OUTPATIENT_CLINIC_OR_DEPARTMENT_OTHER)
Admission: EM | Admit: 2022-01-16 | Discharge: 2022-01-16 | Disposition: A | Discharge status: Home / Self Care | Payer: BC Managed Care – PPO | Attending: Emergency Medicine | Admitting: Emergency Medicine

## 2022-01-16 ENCOUNTER — Emergency Department (HOSPITAL_BASED_OUTPATIENT_CLINIC_OR_DEPARTMENT_OTHER): Payer: BC Managed Care – PPO

## 2022-01-16 ENCOUNTER — Emergency Department (HOSPITAL_BASED_OUTPATIENT_CLINIC_OR_DEPARTMENT_OTHER): Payer: BC Managed Care – PPO | Admitting: Radiology

## 2022-01-16 ENCOUNTER — Encounter (HOSPITAL_BASED_OUTPATIENT_CLINIC_OR_DEPARTMENT_OTHER): Payer: Self-pay | Admitting: Emergency Medicine

## 2022-01-16 DIAGNOSIS — N179 Acute kidney failure, unspecified: Secondary | ICD-10-CM | POA: Diagnosis not present

## 2022-01-16 DIAGNOSIS — I10 Essential (primary) hypertension: Secondary | ICD-10-CM | POA: Insufficient documentation

## 2022-01-16 DIAGNOSIS — R300 Dysuria: Secondary | ICD-10-CM | POA: Diagnosis present

## 2022-01-16 DIAGNOSIS — R059 Cough, unspecified: Secondary | ICD-10-CM | POA: Diagnosis not present

## 2022-01-16 LAB — COMPREHENSIVE METABOLIC PANEL
ALT: 20 U/L (ref 0–44)
AST: 20 U/L (ref 15–41)
Albumin: 4.6 g/dL (ref 3.5–5.0)
Alkaline Phosphatase: 70 U/L (ref 38–126)
Anion gap: 9 (ref 5–15)
BUN: 50 mg/dL — ABNORMAL HIGH (ref 6–20)
CO2: 26 mmol/L (ref 22–32)
Calcium: 11 mg/dL — ABNORMAL HIGH (ref 8.9–10.3)
Chloride: 101 mmol/L (ref 98–111)
Creatinine, Ser: 1.88 mg/dL — ABNORMAL HIGH (ref 0.61–1.24)
GFR, Estimated: 42 mL/min — ABNORMAL LOW (ref >60)
Glucose, Bld: 115 mg/dL — ABNORMAL HIGH (ref 70–99)
Potassium: 5 mmol/L (ref 3.5–5.1)
Sodium: 136 mmol/L (ref 135–145)
Total Bilirubin: 0.6 mg/dL (ref 0.3–1.2)
Total Protein: 8.3 g/dL — ABNORMAL HIGH (ref 6.5–8.1)

## 2022-01-16 LAB — CBC WITH DIFFERENTIAL/PLATELET
Abs Immature Granulocytes: 0.03 10*3/uL (ref 0.00–0.07)
Basophils Absolute: 0.1 10*3/uL (ref 0.0–0.1)
Basophils Relative: 1 %
Eosinophils Absolute: 0.3 10*3/uL (ref 0.0–0.5)
Eosinophils Relative: 3 %
HCT: 44.9 % (ref 39.0–52.0)
Hemoglobin: 15.2 g/dL (ref 13.0–17.0)
Immature Granulocytes: 0 %
Lymphocytes Relative: 26 %
Lymphs Abs: 2.6 10*3/uL (ref 0.7–4.0)
MCH: 33.5 pg (ref 26.0–34.0)
MCHC: 33.9 g/dL (ref 30.0–36.0)
MCV: 98.9 fL (ref 80.0–100.0)
Monocytes Absolute: 0.7 10*3/uL (ref 0.1–1.0)
Monocytes Relative: 7 %
Neutro Abs: 6.2 10*3/uL (ref 1.7–7.7)
Neutrophils Relative %: 63 %
Platelets: 142 10*3/uL — ABNORMAL LOW (ref 150–400)
RBC: 4.54 MIL/uL (ref 4.22–5.81)
RDW: 14.5 % (ref 11.5–15.5)
WBC: 9.9 10*3/uL (ref 4.0–10.5)
nRBC: 0 % (ref 0.0–0.2)

## 2022-01-16 LAB — URINALYSIS, ROUTINE W REFLEX MICROSCOPIC
Bilirubin Urine: NEGATIVE
Glucose, UA: >1000 mg/dL — AB
Hgb urine dipstick: NEGATIVE
Ketones, ur: NEGATIVE mg/dL
Leukocytes,Ua: NEGATIVE
Nitrite: NEGATIVE
Protein, ur: 30 mg/dL — AB
Specific Gravity, Urine: 1.027 (ref 1.005–1.030)
pH: 5.5 (ref 5.0–8.0)

## 2022-01-16 MED ORDER — SODIUM CHLORIDE 0.9 % IV BOLUS
1000.0000 mL | Freq: Once | INTRAVENOUS | Status: AC
  Administered 2022-01-16: 1000 mL via INTRAVENOUS

## 2022-01-16 MED ORDER — DOXYCYCLINE HYCLATE 100 MG PO CAPS: 100.0000 mg | ORAL_CAPSULE | Freq: Two times a day (BID) | ORAL | 0 refills | Status: DC

## 2022-01-16 NOTE — ED Triage Notes (Signed)
Had labs drawn yesterday and was told to come to ER  for abnormal creatine and  and GFR , pt not feeling well has been having a diff  time peeing he states x weveral months

## 2022-01-16 NOTE — ED Notes (Signed)
Discharge paperwork given and verbally understood. Pt left with everything he had upon arrival, Pt confirmed.

## 2022-01-16 NOTE — Discharge Instructions (Addendum)
There was a lung mass versus potentially infection on the x-ray.  We will be giving you some antibiotic since you have been coughing.  Your creatinine has come down to 1.8.  Your calcium was a little high but you have been hydrated.  Try and keep hydrating yourself.  Follow-up with Dr. Arelia Sneddon on Monday.  The ultrasound also showed a cyst versus a mass coming out of the right kidney.  This will also need further work-up.

## 2022-01-16 NOTE — ED Provider Notes (Signed)
[facility] EMERGENCY DEPT Provider Note   CSN: [id number] Arrival date & time: 01/16/22  1326     History  Chief Complaint  Patient presents with   Dysuria    Vincent Watkins is a 51 y.o. male.  HPI Patient presents with lab abnormalities.  Creatinine above 3 per blood work at PCP.  Baseline is normal.  States he has been fatigued for the last few days.  States he was outside yesterday and it was hot.  States has had some difficulty urinating now for months.  Gets up around 4 times a night to urinate.  States he has difficulty starting a stream.  Also some trouble getting erections.  No fevers.  No cough.   Past Medical History:  Diagnosis Date   Hypertension    Pre-diabetes    Smoker     Home Medications Prior to Admission medications   Medication Sig Start Date End Date Taking? Authorizing Provider  doxycycline (VIBRAMYCIN) 100 MG capsule Take 1 capsule (100 mg total) by mouth 2 (two) times daily. 01/16/22  Yes Davonna Belling, MD  cetirizine (ZYRTEC) 10 MG tablet Take 1 tablet (10 mg total) by mouth daily. 10/18/21   Talbot Grumbling, FNP  folic acid (FOLVITE) 1 MG tablet Take 1 mg by mouth daily. 01/26/19   [provider]  lisinopril (ZESTRIL) 20 MG tablet Take 20 mg by mouth daily.  01/26/19   [provider]  metFORMIN (GLUCOPHAGE) 500 MG tablet Take 500 mg by mouth daily. 01/26/19   [provider]  Thiamine HCl (VITAMIN B1) 100 MG TABS Take 1 capsule by mouth daily. 01/26/19   [provider]      Allergies    Patient has no known allergies.    Review of Systems   Review of Systems  Physical Exam Updated Vital Signs BP 137/89 (BP Location: Right Arm)   Pulse 85   Temp 98.2 F (36.8 C) (Oral)   Resp 16   Ht '5\' 9"'$  (1.753 m)   Wt 91.6 kg   SpO2 100%   BMI 29.83 kg/m  Physical Exam Vitals and nursing note reviewed.  HENT:     Head: Normocephalic.  Cardiovascular:     Rate and Rhythm: Regular rhythm.   Pulmonary:     Breath sounds: No wheezing or rhonchi.  Abdominal:     Tenderness: There is no abdominal tenderness.  Musculoskeletal:        General: No tenderness.  Skin:    General: Skin is warm.  Neurological:     Mental Status: He is alert.     ED Results / Procedures / Treatments   Labs (all labs ordered are listed, but only abnormal results are displayed) Labs Reviewed  CBC WITH DIFFERENTIAL/PLATELET - Abnormal; Notable for the following components:      Result Value   Platelets 142 (*)    All other components within normal limits  COMPREHENSIVE METABOLIC PANEL - Abnormal; Notable for the following components:   Glucose, Bld 115 (*)    BUN 50 (*)    Creatinine, Ser 1.88 (*)    Calcium 11.0 (*)    Total Protein 8.3 (*)    GFR, Estimated 42 (*)    All other components within normal limits  URINALYSIS, ROUTINE W REFLEX MICROSCOPIC - Abnormal; Notable for the following components:   Glucose, UA >1,000 (*)    Protein, ur 30 (*)    All other components within normal limits    EKG None  Radiology US Renal  Result Date: 01/16/2022 CLINICAL DATA:  Kidney disease. Difficulty urinating for a few months. Brother with history of renal carcinoma. EXAM: RENAL / URINARY TRACT ULTRASOUND COMPLETE COMPARISON:  None Available. FINDINGS: Right Kidney: Renal measurements: 11.5 x 4.3 x 4.5 cm = volume: 116 mL. Echogenicity within normal limits. There is a hypoechoic round lesion extending exophytically off of the inferolateral aspect of the right kidney measuring up to approximately 2.8 x 2.7 x 2.5 cm. This demonstrates thin internal septations. Possible minimal peripheral blood flow. This is indeterminate for a complex cyst versus solid mass. No hydronephrosis. Left Kidney: Renal measurements: 11.5 x 5.4 x 4.8 cm = volume: 158 mL. Echogenicity within normal limits. No mass or hydronephrosis visualized. Bladder: Appears normal for degree of bladder distention. Bilateral ureteral jets were  visualized. Other: None. IMPRESSION: There is a hypoechoic exophytic lesion extending laterally from the mid to lower pole of the right kidney, indeterminate for a solid mass versus complex cyst. Renal cell carcinoma cannot be excluded on the basis of the current exam. Recommend nonemergent either CT abdomen without and with contrast (renal mass protocol) or MRI of the abdomen without and with contrast for further evaluation. Electronically Signed   By: Yvonne Kendall M.D.   On: 01/16/2022 17:17   DG Chest 2 View  Result Date: 01/16/2022 CLINICAL DATA:  Cough EXAM: CHEST - 2 VIEW COMPARISON:  03/29/2020 FINDINGS: Cardiac size is within normal limits. There is no evidence of pulmonary edema. There is no focal pulmonary consolidation. There is no pleural effusion or pneumothorax. There is faint 1.8 cm radiopacity in the left parahilar region which was not evident on the previous study. IMPRESSION: There is a new faint 1.8 cm radiopacity in the left parahilar region which may suggest a pleural plaque or early pneumonia or lung nodule. Short-term follow-up chest radiographs along with CT if warranted should be considered. Chest radiographs are otherwise unremarkable. Electronically Signed   By: Elmer Picker M.D.   On: 01/16/2022 14:03    Procedures Procedures    Medications Ordered in ED Medications  sodium chloride 0.9 % bolus 1,000 mL (0 mLs Intravenous Stopped 01/16/22 1845)  sodium chloride 0.9 % bolus 1,000 mL (0 mLs Intravenous Stopped 01/16/22 1845)    ED Course/ Medical Decision Making/ A&P                           Medical Decision Making Amount and/or Complexity of Data Reviewed Labs: ordered. Radiology: ordered.  Risk Prescription drug management.   Patient presents with worsening kidney function.  Creatinine normal at baseline but now above 3 per blood work.  Will get recheck blood work here.  Will check for postvoid residual.  We will check urinalysis. Chest x-ray also done and  independently interpreted does show a potential lung nodule versus mass versus infection. Patient has had a cough so we will treat with antibiotics for potential infection although informed patient of need for follow-up and possibility of cancer.   Creatinine has improved from yesterday.  Creatinine now 1.8.  Potassium normal although does have mild hypercalcemia.  BUN of 50 which would go along with potentially a prerenal acute kidney injury.  States he been working outside and had been very hot.  Has been somewhat fatigued also.  Hydrated with 2 L normal saline here.  Can follow-up on Monday with PCP. Renal ultrasound done and showed exophytic cyst versus mass.  Also informed of this.  We will follow-up with PCP who can arrange further follow-up. Does not appear to require admission to the hospital at this time.        Final Clinical Impression(s) / ED Diagnoses Final diagnoses:  AKI (acute kidney injury) ([place])  Hypercalcemia    Rx / DC Orders ED Discharge Orders          Ordered    doxycycline (VIBRAMYCIN) 100 MG capsule  2 times daily        01/16/22 1903              Davonna Belling, MD 01/16/22 1905

## 2022-01-20 ENCOUNTER — Other Ambulatory Visit: Payer: Self-pay | Admitting: Nurse Practitioner

## 2022-01-20 DIAGNOSIS — D49511 Neoplasm of unspecified behavior of right kidney: Secondary | ICD-10-CM

## 2022-01-20 DIAGNOSIS — N2889 Other specified disorders of kidney and ureter: Secondary | ICD-10-CM

## 2022-01-21 ENCOUNTER — Other Ambulatory Visit: Payer: Self-pay | Admitting: Family Medicine

## 2022-01-21 ENCOUNTER — Other Ambulatory Visit: Payer: Self-pay | Admitting: Nurse Practitioner

## 2022-01-21 DIAGNOSIS — R918 Other nonspecific abnormal finding of lung field: Secondary | ICD-10-CM

## 2022-01-22 ENCOUNTER — Ambulatory Visit
Admission: RE | Admit: 2022-01-22 | Discharge: 2022-01-22 | Disposition: A | Discharge status: Home / Self Care | Payer: BC Managed Care – PPO | Source: Ambulatory Visit | Attending: Nurse Practitioner | Admitting: Nurse Practitioner

## 2022-01-22 DIAGNOSIS — R918 Other nonspecific abnormal finding of lung field: Secondary | ICD-10-CM

## 2022-01-22 MED ORDER — IOPAMIDOL (ISOVUE-300) INJECTION 61%
75.0000 mL | Freq: Once | INTRAVENOUS | Status: AC | PRN
  Administered 2022-01-22: 75 mL via INTRAVENOUS

## 2022-02-01 ENCOUNTER — Ambulatory Visit
Admission: RE | Admit: 2022-02-01 | Discharge: 2022-02-01 | Disposition: A | Discharge status: Home / Self Care | Payer: BC Managed Care – PPO | Source: Ambulatory Visit | Attending: Nurse Practitioner | Admitting: Nurse Practitioner

## 2022-02-01 DIAGNOSIS — N2889 Other specified disorders of kidney and ureter: Secondary | ICD-10-CM

## 2022-02-01 DIAGNOSIS — D49511 Neoplasm of unspecified behavior of right kidney: Secondary | ICD-10-CM

## 2022-02-01 MED ORDER — GADOBENATE DIMEGLUMINE 529 MG/ML IV SOLN
20.0000 mL | Freq: Once | INTRAVENOUS | Status: AC | PRN
Start: 2022-02-01 — End: 2022-02-01
  Administered 2022-02-01: 20 mL via INTRAVENOUS

## 2022-02-14 ENCOUNTER — Ambulatory Visit
Admission: EM | Admit: 2022-02-14 | Discharge: 2022-02-14 | Discharge status: Against Medical Advice | Payer: BC Managed Care – PPO

## 2022-02-14 DIAGNOSIS — S0191XA Laceration without foreign body of unspecified part of head, initial encounter: Secondary | ICD-10-CM

## 2022-02-14 NOTE — ED Notes (Signed)
Incomplete triage 2/2 leaving [facility]
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
## 2022-02-23 ENCOUNTER — Other Ambulatory Visit: Payer: Self-pay | Admitting: Urology

## 2022-03-20 NOTE — Patient Instructions (Signed)
DUE TO COVID-19 ONLY TWO VISITORS  (aged 52 and older)  ARE ALLOWED TO COME WITH YOU AND STAY IN THE WAITING ROOM ONLY DURING PRE OP AND PROCEDURE.   **NO VISITORS ARE ALLOWED IN THE SHORT STAY AREA OR RECOVERY ROOM!!**  IF YOU WILL BE ADMITTED INTO THE HOSPITAL YOU ARE ALLOWED ONLY FOUR SUPPORT PEOPLE DURING VISITATION HOURS ONLY (7 AM -8PM)   The support person(s) must pass our screening, gel in and out, and wear a mask at all times, including in the patient's room. Patients must also wear a mask when staff or their support person are in the room. Visitors GUEST BADGE MUST BE WORN VISIBLY  One adult visitor may remain with you overnight and MUST be in the room by 8 P.M.     Your procedure is scheduled on: 03/31/22   Report to [facility] Main Entrance    Report to admitting at : 5:15 AM   Call this number if you have problems the morning of surgery [phone]  Clear liquids starting the day before surgery until : 4:30 AM DAY OF SURGERY  Water Black Coffee (sugar ok, NO MILK/CREAM OR CREAMERS)  Tea (sugar ok, NO MILK/CREAM OR CREAMERS) regular and decaf                             Plain Jell-O (NO RED)                                           Fruit ices (not with fruit pulp, NO RED)                                     Popsicles (NO RED)                                                                  Juice: apple, WHITE grape, WHITE cranberry Sports drinks like Gatorade (NO RED)              FOLLOW BOWEL PREP AND ANY ADDITIONAL PRE OP INSTRUCTIONS YOU RECEIVED FROM YOUR SURGEON'S OFFICE!!!   Oral Hygiene is also important to reduce your risk of infection.                                    Remember - BRUSH YOUR TEETH THE MORNING OF SURGERY WITH YOUR REGULAR TOOTHPASTE   Do NOT smoke after Midnight   Take these medicines the morning of surgery with A SIP OF WATER:duloxetine.                               You may not have any metal on your body including hair pins,  jewelry, and body piercing             Do not wear lotions, powders, perfumes/cologne, or deodorant  Men may shave face and neck.   Do not bring valuables to the hospital. [place].   Contacts, dentures or bridgework may not be worn into surgery.   Bring small overnight bag day of surgery.   DO NOT [place]. PHARMACY WILL DISPENSE MEDICATIONS LISTED ON YOUR MEDICATION LIST TO YOU DURING YOUR ADMISSION [place]!    Patients discharged on the day of surgery will not be allowed to drive home.  Someone NEEDS to stay with you for the first 24 hours after anesthesia.   Special Instructions: Bring a copy of your healthcare power of attorney and living will documents         the day of surgery if you haven't scanned them before.              Please read over the following fact sheets you were given: IF YOU HAVE QUESTIONS ABOUT YOUR PRE-OP INSTRUCTIONS PLEASE CALL [phone]    [facility] - Preparing for Surgery Before surgery, you can play an important role.  Because skin is not sterile, your skin needs to be as free of germs as possible.  You can reduce the number of germs on your skin by washing with CHG (chlorahexidine gluconate) soap before surgery.  CHG is an antiseptic cleaner which kills germs and bonds with the skin to continue killing germs even after washing. Please DO NOT use if you have an allergy to CHG or antibacterial soaps.  If your skin becomes reddened/irritated stop using the CHG and inform your nurse when you arrive at Short Stay. Do not shave (including legs and underarms) for at least 48 hours prior to the first CHG shower.  You may shave your face/neck. Please follow these instructions carefully:  1.  Shower with CHG Soap the night before surgery and the  morning of Surgery.  2.  If you choose to wash your hair, wash your hair first as usual with your  normal   shampoo.  3.  After you shampoo, rinse your hair and body thoroughly to remove the  shampoo.                           4.  Use CHG as you would any other liquid soap.  You can apply chg directly  to the skin and wash                       Gently with a scrungie or clean washcloth.  5.  Apply the CHG Soap to your body ONLY FROM THE NECK DOWN.   Do not use on face/ open                           Wound or open sores. Avoid contact with eyes, ears mouth and genitals (private parts).                       Wash face,  Genitals (private parts) with your normal soap.             6.  Wash thoroughly, paying special attention to the area where your surgery  will be performed.  7.  Thoroughly rinse your body with warm water from the neck down.  8.  DO  NOT shower/wash with your normal soap after using and rinsing off  the CHG Soap.                9.  Pat yourself dry with a clean towel.            10.  Wear clean pajamas.            11.  Place clean sheets on your bed the night of your first shower and do not  sleep with pets. Day of Surgery : Do not apply any lotions/deodorants the morning of surgery.  Please wear clean clothes to the hospital/surgery center.  FAILURE TO FOLLOW THESE INSTRUCTIONS MAY RESULT IN THE CANCELLATION OF YOUR SURGERY PATIENT SIGNATURE_________________________________  NURSE SIGNATURE__________________________________  ________________________________________________________________________

## 2022-03-23 ENCOUNTER — Encounter (HOSPITAL_COMMUNITY)
Admission: RE | Admit: 2022-03-23 | Discharge: 2022-03-23 | Disposition: A | Discharge status: Home / Self Care | Payer: BC Managed Care – PPO | Source: Ambulatory Visit | Attending: Urology | Admitting: Urology

## 2022-03-23 ENCOUNTER — Encounter (HOSPITAL_COMMUNITY): Payer: Self-pay

## 2022-03-23 ENCOUNTER — Other Ambulatory Visit: Payer: Self-pay

## 2022-03-23 VITALS — BP 152/95 | HR 101 | Temp 97.8°F | Ht 69.0 in | Wt 205.0 lb

## 2022-03-23 DIAGNOSIS — Z01818 Encounter for other preprocedural examination: Secondary | ICD-10-CM | POA: Insufficient documentation

## 2022-03-23 DIAGNOSIS — E119 Type 2 diabetes mellitus without complications: Secondary | ICD-10-CM | POA: Diagnosis not present

## 2022-03-23 DIAGNOSIS — R9431 Abnormal electrocardiogram [ECG] [EKG]: Secondary | ICD-10-CM | POA: Diagnosis not present

## 2022-03-23 DIAGNOSIS — I2510 Atherosclerotic heart disease of native coronary artery without angina pectoris: Secondary | ICD-10-CM | POA: Diagnosis not present

## 2022-03-23 HISTORY — DX: Chronic kidney disease, unspecified: N18.9

## 2022-03-23 HISTORY — DX: Pneumonia, unspecified organism: J18.9

## 2022-03-23 LAB — BASIC METABOLIC PANEL
Anion gap: 10 (ref 5–15)
BUN: 18 mg/dL (ref 6–20)
CO2: 25 mmol/L (ref 22–32)
Calcium: 9.2 mg/dL (ref 8.9–10.3)
Chloride: 101 mmol/L (ref 98–111)
Creatinine, Ser: 0.88 mg/dL (ref 0.61–1.24)
GFR, Estimated: >60 mL/min (ref >60)
Glucose, Bld: 142 mg/dL — ABNORMAL HIGH (ref 70–99)
Potassium: 4.5 mmol/L (ref 3.5–5.1)
Sodium: 136 mmol/L (ref 135–145)

## 2022-03-23 LAB — CBC
HCT: 47 % (ref 39.0–52.0)
Hemoglobin: 15.8 g/dL (ref 13.0–17.0)
MCH: 35.3 pg — ABNORMAL HIGH (ref 26.0–34.0)
MCHC: 33.6 g/dL (ref 30.0–36.0)
MCV: 105.1 fL — ABNORMAL HIGH (ref 80.0–100.0)
Platelets: 135 10*3/uL — ABNORMAL LOW (ref 150–400)
RBC: 4.47 MIL/uL (ref 4.22–5.81)
RDW: 13.7 % (ref 11.5–15.5)
WBC: 4.7 10*3/uL (ref 4.0–10.5)
nRBC: 0 % (ref 0.0–0.2)

## 2022-03-23 LAB — HEMOGLOBIN A1C
Hgb A1c MFr Bld: 5.9 % — ABNORMAL HIGH (ref 4.8–5.6)
Mean Plasma Glucose: 122.63 mg/dL

## 2022-03-23 LAB — GLUCOSE, CAPILLARY: Glucose-Capillary: 158 mg/dL — ABNORMAL HIGH (ref 70–99)

## 2022-03-23 NOTE — Progress Notes (Signed)
For Short Stay: [place] appointment date:  Bowel Prep reminder:   For Anesthesia: PCP - Dr. Majel Homer Cardiologist -   Chest x-ray - 01/16/22 EKG -  Stress Test -  ECHO -  Cardiac Cath -  Pacemaker/ICD device last checked: Pacemaker orders received: Device Rep notified:  Spinal Cord Stimulator:  Sleep Study -  CPAP -   Fasting Blood Sugar - N/A Checks Blood Sugar ___0__ times a day Date and result of last Hgb A1c-  Last dose of GLP1 agonist-  GLP1 instructions:   Last dose of SGLT-2 inhibitors-  SGLT-2 instructions:   Blood Thinner Instructions: Aspirin Instructions: Last Dose:  Activity level: Can go up a flight of stairs and activities of daily living without stopping and without chest pain and/or shortness of breath   Able to exercise without chest pain and/or shortness of breath   Unable to go up a flight of stairs without chest pain and/or shortness of breath     Anesthesia review: Hx: Pre-DIA,Smoker,HTN  Patient denies shortness of breath, fever, cough and chest pain at PAT appointment   Patient verbalized understanding of instructions that were given to them at the PAT appointment. Patient was also instructed that they will need to review over the PAT instructions again at home before surgery.

## 2022-03-23 NOTE — Progress Notes (Signed)
   03/23/22 1016  OBSTRUCTIVE SLEEP APNEA  Have you ever been diagnosed with sleep apnea through a sleep study? No  Do you snore loudly (loud enough to be heard through closed doors)?  1  Do you often feel tired, fatigued, or sleepy during the daytime (such as falling asleep during driving or talking to someone)? 1  Has anyone observed you stop breathing during your sleep? 1  Do you have, or are you being treated for high blood pressure? 1  Age > 50 (1-yes) 1  Neck circumference greater than:Male 16 inches or larger, Male 17inches or larger? 0  Male Gender (Yes=1) 1  Obstructive Sleep Apnea Score 6  Score 5 or greater  Results sent to PCP

## 2022-03-30 NOTE — H&P (Signed)
Office Visit Report     03/20/2022   --------------------------------------------------------------------------------   Vincent Watkins  MRN: [id number]  DOB: [birth year], 51 year old Male  SSN:    PRIMARY CARE:    REFERRING:  Janita Camberos A. Vincent Neighbours, MD  PROVIDER:  Olene Craven. Cain Sieve, M.D.  TREATING:  Jiles Crocker, NP  LOCATION:  [facility] [phone]     --------------------------------------------------------------------------------   CC/HPI: Right renal lesion   Vincent Watkins is a 51 year old male, referred by Dr. 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 The mass was initially discovered on RUS from 01/2022 during an evaluation for renal dysfunction and further characterized via MRI.   -Anatomy: Right, posterior lower pole cystic lesion. Early branching of right renal artery and single right renal vein. No abnormalities are seen involving the left kidney  -Personal/family history of GU malignancies: Brother passed away from stage IV RCC  -Smoking history: 1-2 ppd smoker for the past 35+ year  -Prior abdominal surgeries: none  -Renal function: No labs available.  -History of kidney stones: Remote history of kidney stones that he passed spontaneously  -Chest CT from 01/2022 showed no concerning thoracic abnormalities.   03/20/2022: Patient here today for preoperative appointment prior to undergoing robotic assisted right partial nephrectomy with Dr. Lovena Watkins on 11/21. Patient denies any changes in past medical history since time of last office visit assessment. No new prescription medications taken on daily basis, no interval surgical or procedural intervention. He denies any right-sided pain or discomfort suggestive of obstructive uropathy. He is voiding at his baseline with stable, grossly nonbothersome symptomology. No interval dysuria or gross hematuria. He endorses normal daily bowel movements. No recent fever/chills,  nausea/vomiting, antibiotic treatment for any infectious process, no recent chest pain, shortness of breath, lightheadedness or dizziness. He continues to smoke and has been previously appropriately counseled about cessation.     ALLERGIES: None   MEDICATIONS: Lisinopril  Duloxetine Hcl     GU PSH: None     PSH Notes: foot surgery 2003  fatty tumor removed    NON-GU PSH: None   GU PMH: Renal cyst, Bosniak 3 renal cyst - 02/16/2022, - 02/10/2022    NON-GU PMH: Anxiety Arthritis Asthma GERD Heart disease, unspecified Hypertension    FAMILY HISTORY: 1 Daughter - Daughter 1 son - Son Diabetes - Father Gout - Father Hypercholesterolemia - Mother Hypertension - Father Kidney Cancer - Runs in Family   SOCIAL HISTORY: Marital Status: Single Preferred Language: English; Ethnicity: Not Hispanic Or Latino; Race: White Current Smoking Status: Patient smokes. Has smoked since 02/09/1987. Smokes 1 pack per day.   Tobacco Use Assessment Completed: Used Tobacco in last 30 days? Types of alcohol consumed: Liquor. Excessive Drinker.     REVIEW OF SYSTEMS:    GU Review Male:   Patient denies frequent urination, hard to postpone urination, burning/ pain with urination, get up at night to urinate, leakage of urine, stream starts and stops, trouble starting your stream, have to strain to urinate , erection problems, and penile pain.  Gastrointestinal (Upper):   Patient denies nausea, vomiting, and indigestion/ heartburn.  Gastrointestinal (Lower):   Patient denies diarrhea and constipation.  Constitutional:   Patient denies fever, night sweats, weight loss, and fatigue.  Skin:   Patient denies skin rash/ lesion and itching.  Eyes:   Patient denies blurred vision and double vision.  Ears/ Nose/ Throat:   Patient denies sore throat and sinus problems.  Hematologic/Lymphatic:   Patient denies swollen glands and easy bruising.  Cardiovascular:   Patient denies leg swelling and chest pains.   Respiratory:   Patient denies cough and shortness of breath.  Endocrine:   Patient denies excessive thirst.  Musculoskeletal:   Patient reports joint pain. Patient denies back pain.  Neurological:   Patient denies headaches and dizziness.  Psychologic:   Patient denies depression and anxiety.   VITAL SIGNS:      03/20/2022 10:41 AM  Weight 200 lb / 90.72 kg  Height 69.5 in / 176.53 cm  BP 131/88 mmHg  Pulse 98 /min  Temperature 97.1 F / 36.1 C  BMI 29.1 kg/m   MULTI-SYSTEM PHYSICAL EXAMINATION:    Constitutional: Well-nourished. No physical deformities. Normally developed. Good grooming.  Neck: Neck symmetrical, not swollen. Normal tracheal position.  Respiratory: No labored breathing, no use of accessory muscles.   Cardiovascular: Normal temperature, normal extremity pulses, no swelling, no varicosities.  Skin: No paleness, no jaundice, no cyanosis. No lesion, no ulcer, no rash.  Neurologic / Psychiatric: Oriented to time, oriented to place, oriented to person. No depression, no anxiety, no agitation.  Gastrointestinal: Obese abdomen. No mass, no tenderness, no rigidity.   Musculoskeletal: Normal gait and station of head and neck.     Complexity of Data:  Source Of History:  Patient, Family/Caregiver, Medical Record Summary  Lab Test Review:   BMP  Records Review:   Previous Doctor Records, Previous Hospital Records, Previous Patient Records  Urine Test Review:   Urinalysis  X-Ray Review: MRI Abdomen: Reviewed Films. Reviewed Report.     03/20/22 02/16/22  General Chemistry  Sodium  141 mEq/L  Potassium  3.8 mEq/L  BUN  16 mg/dL  Creatinine  0.9 mg/dL  Chloride  103 mEq/L  CO2  32 mEq/L  Glucose  130 mg/dL  Calcium  9.9 mg/dL  eGFR African American  113.4   eGFR Non-Afr. American  97.9   Urinalysis  Urine Appearance Clear    Urine Color Yellow    Urine Glucose 2+ mg/dL   Urine Bilirubin Neg mg/dL   Urine Ketones Neg mg/dL   Urine Specific Gravity 1.025    Urine  Blood Neg ery/uL   Urine pH 6.0    Urine Protein Neg mg/dL   Urine Urobilinogen 0.2 mg/dL   Urine Nitrites Neg    Urine Leukocyte Esterase Neg leu/uL    PROCEDURES:          Urinalysis Dipstick Dipstick Cont'd  Color: Yellow Bilirubin: Neg mg/dL  Appearance: Clear Ketones: Neg mg/dL  Specific Gravity: 1.025 Blood: Neg ery/uL  pH: 6.0 Protein: Neg mg/dL  Glucose: 2+ mg/dL Urobilinogen: 0.2 mg/dL    Nitrites: Neg    Leukocyte Esterase: Neg leu/uL    ASSESSMENT:      ICD-10 Details  1 GU:   Renal cyst - N28.1 Right, Chronic, Stable  2 NON-GU:   Encounter for other preprocedural examination - Z01.818 Undiagnosed New Problem   PLAN:           Orders Labs Urine Culture          Schedule Return Visit/Planned Activity: Keep Scheduled Appointment - Follow up MD, Schedule Surgery          Document Letter(s):  Created for Patient: Clinical Summary         Notes:   -I personally reviewed imaging results and films with the patient. We discussed that the mass in question has features concerning for malignancy. I  explained the natural history of presumed renal cell carcinoma. I reviewed the AUA guidelines for evaluation and treatment of the small renal mass. The options of active surveillance, in situ tumor ablation, partial and radical nephrectomy was discussed. The risks of robot-assisted RIGHT partial nephrectomy were discussed in detail including but not limited to: negative pathology, open conversion, completion nephrectomy, infection of the urinary tract/skin/abdominal cavity, VTE, MI/CVA, lymphatic leak, injury to adjacent solid/hollow viscus organs, bleeding requiring a blood transfusion, catastrophic bleeding, hernia formation, need for postoperative angioembolization, urinary leak requiring stent/drain, and other imponderables.

## 2022-03-31 ENCOUNTER — Other Ambulatory Visit (HOSPITAL_COMMUNITY): Payer: Self-pay

## 2022-03-31 ENCOUNTER — Other Ambulatory Visit: Payer: Self-pay

## 2022-03-31 ENCOUNTER — Inpatient Hospital Stay (HOSPITAL_COMMUNITY): Payer: BC Managed Care – PPO | Admitting: Certified Registered"

## 2022-03-31 ENCOUNTER — Encounter (HOSPITAL_COMMUNITY)
Admission: RE | Disposition: A | Discharge status: Home / Self Care | Payer: Self-pay | Source: Ambulatory Visit | Attending: Urology

## 2022-03-31 ENCOUNTER — Inpatient Hospital Stay (HOSPITAL_COMMUNITY)
Admission: RE | Admit: 2022-03-31 | Discharge: 2022-04-01 | DRG: 657 | Disposition: A | Discharge status: Home / Self Care | Payer: BC Managed Care – PPO | Source: Ambulatory Visit | Attending: Urology | Admitting: Urology

## 2022-03-31 ENCOUNTER — Encounter (HOSPITAL_COMMUNITY): Payer: Self-pay | Admitting: Urology

## 2022-03-31 DIAGNOSIS — F17210 Nicotine dependence, cigarettes, uncomplicated: Secondary | ICD-10-CM | POA: Diagnosis present

## 2022-03-31 DIAGNOSIS — Z79899 Other long term (current) drug therapy: Secondary | ICD-10-CM

## 2022-03-31 DIAGNOSIS — N281 Cyst of kidney, acquired: Secondary | ICD-10-CM | POA: Diagnosis present

## 2022-03-31 DIAGNOSIS — I10 Essential (primary) hypertension: Secondary | ICD-10-CM | POA: Diagnosis present

## 2022-03-31 DIAGNOSIS — Z8051 Family history of malignant neoplasm of kidney: Secondary | ICD-10-CM | POA: Diagnosis not present

## 2022-03-31 DIAGNOSIS — C641 Malignant neoplasm of right kidney, except renal pelvis: Secondary | ICD-10-CM | POA: Diagnosis present

## 2022-03-31 DIAGNOSIS — S36530A Laceration of ascending [right] colon, initial encounter: Secondary | ICD-10-CM | POA: Diagnosis not present

## 2022-03-31 DIAGNOSIS — E669 Obesity, unspecified: Secondary | ICD-10-CM | POA: Diagnosis present

## 2022-03-31 DIAGNOSIS — N2889 Other specified disorders of kidney and ureter: Secondary | ICD-10-CM | POA: Diagnosis present

## 2022-03-31 DIAGNOSIS — Z6831 Body mass index (BMI) 31.0-31.9, adult: Secondary | ICD-10-CM | POA: Diagnosis not present

## 2022-03-31 DIAGNOSIS — K9181 Other intraoperative complications of digestive system: Secondary | ICD-10-CM | POA: Diagnosis not present

## 2022-03-31 DIAGNOSIS — F1010 Alcohol abuse, uncomplicated: Secondary | ICD-10-CM | POA: Diagnosis present

## 2022-03-31 HISTORY — PX: ROBOTIC ASSITED PARTIAL NEPHRECTOMY: SHX6087

## 2022-03-31 LAB — HEMOGLOBIN AND HEMATOCRIT, BLOOD
HCT: 43.5 % (ref 39.0–52.0)
Hemoglobin: 14.5 g/dL (ref 13.0–17.0)

## 2022-03-31 LAB — TYPE AND SCREEN
ABO/RH(D): B POS
Antibody Screen: NEGATIVE

## 2022-03-31 LAB — GLUCOSE, CAPILLARY: Glucose-Capillary: 163 mg/dL — ABNORMAL HIGH (ref 70–99)

## 2022-03-31 SURGERY — NEPHRECTOMY, PARTIAL, ROBOT-ASSISTED
Anesthesia: General | Site: Abdomen | Laterality: Right

## 2022-03-31 MED ORDER — FENTANYL CITRATE (PF) 100 MCG/2ML IJ SOLN
INTRAMUSCULAR | Status: AC
  Filled 2022-03-31: qty 2

## 2022-03-31 MED ORDER — LORAZEPAM 2 MG/ML IJ SOLN
1.0000 mg | INTRAMUSCULAR | Status: DC | PRN
  Administered 2022-03-31: 2 mg via INTRAVENOUS
  Filled 2022-03-31: qty 1

## 2022-03-31 MED ORDER — ADULT MULTIVITAMIN W/MINERALS CH
1.0000 | ORAL_TABLET | Freq: Every day | ORAL | Status: DC
  Administered 2022-03-31 – 2022-04-01 (×2): 1 via ORAL
  Filled 2022-03-31 (×2): qty 1

## 2022-03-31 MED ORDER — HYDROCODONE-ACETAMINOPHEN 5-325 MG PO TABS
1.0000 | ORAL_TABLET | Freq: Four times a day (QID) | ORAL | 0 refills | Status: DC | PRN
  Filled 2022-03-31: qty 20, 3d supply, fill #0

## 2022-03-31 MED ORDER — KETAMINE HCL 10 MG/ML IJ SOLN
INTRAMUSCULAR | Status: DC | PRN
  Administered 2022-03-31 (×2): 10 mg via INTRAVENOUS
  Administered 2022-03-31: 30 mg via INTRAVENOUS

## 2022-03-31 MED ORDER — LIDOCAINE HCL (PF) 2 % IJ SOLN
INTRAMUSCULAR | Status: AC
  Filled 2022-03-31: qty 5

## 2022-03-31 MED ORDER — ONDANSETRON HCL 4 MG/2ML IJ SOLN
INTRAMUSCULAR | Status: AC
  Filled 2022-03-31: qty 2

## 2022-03-31 MED ORDER — ONDANSETRON HCL 4 MG/2ML IJ SOLN
INTRAMUSCULAR | Status: DC | PRN
  Administered 2022-03-31: 4 mg via INTRAVENOUS

## 2022-03-31 MED ORDER — LISINOPRIL 20 MG PO TABS
20.0000 mg | ORAL_TABLET | Freq: Every day | ORAL | Status: DC
  Administered 2022-03-31 – 2022-04-01 (×2): 20 mg via ORAL
  Filled 2022-03-31 (×2): qty 1

## 2022-03-31 MED ORDER — PROPOFOL 10 MG/ML IV BOLUS
INTRAVENOUS | Status: AC
  Filled 2022-03-31: qty 20

## 2022-03-31 MED ORDER — SODIUM CHLORIDE 0.45 % IV SOLN: INTRAVENOUS | Status: DC

## 2022-03-31 MED ORDER — KETAMINE HCL 10 MG/ML IJ SOLN
INTRAMUSCULAR | Status: AC
  Filled 2022-03-31: qty 1

## 2022-03-31 MED ORDER — DIPHENHYDRAMINE HCL 12.5 MG/5ML PO ELIX: 12.5000 mg | ORAL_SOLUTION | Freq: Four times a day (QID) | ORAL | Status: DC | PRN

## 2022-03-31 MED ORDER — OXYCODONE HCL 5 MG PO TABS
5.0000 mg | ORAL_TABLET | ORAL | Status: DC | PRN
  Administered 2022-03-31 – 2022-04-01 (×4): 5 mg via ORAL
  Filled 2022-03-31 (×3): qty 1

## 2022-03-31 MED ORDER — SODIUM CHLORIDE (PF) 0.9 % IJ SOLN
INTRAMUSCULAR | Status: AC
  Filled 2022-03-31: qty 20

## 2022-03-31 MED ORDER — ROCURONIUM BROMIDE 10 MG/ML (PF) SYRINGE
PREFILLED_SYRINGE | INTRAVENOUS | Status: AC
  Filled 2022-03-31: qty 10

## 2022-03-31 MED ORDER — OXYCODONE HCL 5 MG PO TABS
ORAL_TABLET | ORAL | Status: AC
  Filled 2022-03-31: qty 1

## 2022-03-31 MED ORDER — MIDAZOLAM HCL 2 MG/2ML IJ SOLN
INTRAMUSCULAR | Status: DC | PRN
  Administered 2022-03-31: 2 mg via INTRAVENOUS

## 2022-03-31 MED ORDER — DEXAMETHASONE SODIUM PHOSPHATE 10 MG/ML IJ SOLN
INTRAMUSCULAR | Status: AC
  Filled 2022-03-31: qty 1

## 2022-03-31 MED ORDER — BUPIVACAINE LIPOSOME 1.3 % IJ SUSP
INTRAMUSCULAR | Status: AC
  Filled 2022-03-31: qty 20

## 2022-03-31 MED ORDER — MIDAZOLAM HCL 2 MG/2ML IJ SOLN
INTRAMUSCULAR | Status: AC
  Filled 2022-03-31: qty 2

## 2022-03-31 MED ORDER — OXYCODONE HCL 5 MG/5ML PO SOLN: 5.0000 mg | Freq: Once | ORAL | Status: DC | PRN

## 2022-03-31 MED ORDER — ONDANSETRON HCL 4 MG/2ML IJ SOLN: 4.0000 mg | INTRAMUSCULAR | Status: DC | PRN

## 2022-03-31 MED ORDER — DIPHENHYDRAMINE HCL 50 MG/ML IJ SOLN: 12.5000 mg | Freq: Four times a day (QID) | INTRAMUSCULAR | Status: DC | PRN

## 2022-03-31 MED ORDER — FENTANYL CITRATE PF 50 MCG/ML IJ SOSY
PREFILLED_SYRINGE | INTRAMUSCULAR | Status: AC
  Administered 2022-03-31: 50 ug via INTRAVENOUS
  Filled 2022-03-31: qty 1

## 2022-03-31 MED ORDER — HEMOSTATIC AGENTS (NO CHARGE) OPTIME
TOPICAL | Status: DC | PRN
  Administered 2022-03-31: 1

## 2022-03-31 MED ORDER — ROCURONIUM BROMIDE 10 MG/ML (PF) SYRINGE
PREFILLED_SYRINGE | INTRAVENOUS | Status: DC | PRN
  Administered 2022-03-31: 30 mg via INTRAVENOUS
  Administered 2022-03-31: 20 mg via INTRAVENOUS
  Administered 2022-03-31: 10 mg via INTRAVENOUS
  Administered 2022-03-31: 60 mg via INTRAVENOUS
  Administered 2022-03-31: 30 mg via INTRAVENOUS
  Administered 2022-03-31: 10 mg via INTRAVENOUS

## 2022-03-31 MED ORDER — THIAMINE MONONITRATE 100 MG PO TABS
100.0000 mg | ORAL_TABLET | Freq: Every day | ORAL | Status: DC
  Administered 2022-03-31 – 2022-04-01 (×2): 100 mg via ORAL
  Filled 2022-03-31 (×2): qty 1

## 2022-03-31 MED ORDER — AMISULPRIDE (ANTIEMETIC) 5 MG/2ML IV SOLN: 10.0000 mg | Freq: Once | INTRAVENOUS | Status: DC | PRN

## 2022-03-31 MED ORDER — OXYCODONE HCL 5 MG PO TABS: 5.0000 mg | ORAL_TABLET | Freq: Once | ORAL | Status: DC | PRN

## 2022-03-31 MED ORDER — CEFAZOLIN SODIUM-DEXTROSE 2-4 GM/100ML-% IV SOLN
2.0000 g | INTRAVENOUS | Status: AC
  Administered 2022-03-31: 2 g via INTRAVENOUS
  Filled 2022-03-31: qty 100

## 2022-03-31 MED ORDER — ACETAMINOPHEN 500 MG PO TABS
1000.0000 mg | ORAL_TABLET | Freq: Once | ORAL | Status: AC
  Administered 2022-03-31: 1000 mg via ORAL
  Filled 2022-03-31: qty 2

## 2022-03-31 MED ORDER — LACTATED RINGERS IR SOLN
Status: DC | PRN
  Administered 2022-03-31: 1000 mL

## 2022-03-31 MED ORDER — DULOXETINE HCL 60 MG PO CPEP
60.0000 mg | ORAL_CAPSULE | Freq: Every day | ORAL | Status: DC
  Administered 2022-04-01: 60 mg via ORAL
  Filled 2022-03-31: qty 1

## 2022-03-31 MED ORDER — NICOTINE 14 MG/24HR TD PT24
14.0000 mg | MEDICATED_PATCH | TRANSDERMAL | Status: DC
  Administered 2022-03-31 – 2022-04-01 (×2): 14 mg via TRANSDERMAL
  Filled 2022-03-31 (×2): qty 1

## 2022-03-31 MED ORDER — ACETAMINOPHEN 10 MG/ML IV SOLN
1000.0000 mg | Freq: Four times a day (QID) | INTRAVENOUS | Status: AC
  Administered 2022-03-31 – 2022-04-01 (×4): 1000 mg via INTRAVENOUS
  Filled 2022-03-31 (×4): qty 100

## 2022-03-31 MED ORDER — HYOSCYAMINE SULFATE 0.125 MG SL SUBL: 0.1250 mg | SUBLINGUAL_TABLET | SUBLINGUAL | Status: DC | PRN

## 2022-03-31 MED ORDER — TRIPLE ANTIBIOTIC 3.5-400-5000 EX OINT: 1.0000 | TOPICAL_OINTMENT | Freq: Three times a day (TID) | CUTANEOUS | Status: DC | PRN

## 2022-03-31 MED ORDER — HYDROMORPHONE HCL 1 MG/ML IJ SOLN
INTRAMUSCULAR | Status: AC
  Administered 2022-03-31: 0.5 mg via INTRAVENOUS
  Filled 2022-03-31: qty 1

## 2022-03-31 MED ORDER — DOCUSATE SODIUM 100 MG PO CAPS
100.0000 mg | ORAL_CAPSULE | Freq: Two times a day (BID) | ORAL | Status: DC
  Administered 2022-03-31 – 2022-04-01 (×2): 100 mg via ORAL
  Filled 2022-03-31 (×2): qty 1

## 2022-03-31 MED ORDER — SUGAMMADEX SODIUM 200 MG/2ML IV SOLN
INTRAVENOUS | Status: DC | PRN
  Administered 2022-03-31: 200 mg via INTRAVENOUS

## 2022-03-31 MED ORDER — THIAMINE HCL 100 MG/ML IJ SOLN: 100.0000 mg | Freq: Every day | INTRAMUSCULAR | Status: DC

## 2022-03-31 MED ORDER — PHENYLEPHRINE 80 MCG/ML (10ML) SYRINGE FOR IV PUSH (FOR BLOOD PRESSURE SUPPORT)
PREFILLED_SYRINGE | INTRAVENOUS | Status: DC | PRN
  Administered 2022-03-31: 80 ug via INTRAVENOUS

## 2022-03-31 MED ORDER — LORAZEPAM 1 MG PO TABS: 1.0000 mg | ORAL_TABLET | ORAL | Status: DC | PRN

## 2022-03-31 MED ORDER — SODIUM CHLORIDE (PF) 0.9 % IJ SOLN
INTRAMUSCULAR | Status: DC | PRN
  Administered 2022-03-31: 20 mL

## 2022-03-31 MED ORDER — LACTATED RINGERS IV SOLN: INTRAVENOUS | Status: DC | PRN

## 2022-03-31 MED ORDER — FENTANYL CITRATE PF 50 MCG/ML IJ SOSY
PREFILLED_SYRINGE | INTRAMUSCULAR | Status: AC
  Administered 2022-03-31: 50 ug via INTRAVENOUS
  Filled 2022-03-31: qty 2

## 2022-03-31 MED ORDER — FENTANYL CITRATE (PF) 100 MCG/2ML IJ SOLN
INTRAMUSCULAR | Status: DC | PRN
  Administered 2022-03-31: 100 ug via INTRAVENOUS
  Administered 2022-03-31: 50 ug via INTRAVENOUS
  Administered 2022-03-31: 100 ug via INTRAVENOUS
  Administered 2022-03-31: 50 ug via INTRAVENOUS

## 2022-03-31 MED ORDER — DEXAMETHASONE SODIUM PHOSPHATE 10 MG/ML IJ SOLN
INTRAMUSCULAR | Status: DC | PRN
  Administered 2022-03-31: 8 mg via INTRAVENOUS

## 2022-03-31 MED ORDER — ORAL CARE MOUTH RINSE: 15.0000 mL | Freq: Once | OROMUCOSAL | Status: AC

## 2022-03-31 MED ORDER — FENTANYL CITRATE PF 50 MCG/ML IJ SOSY
25.0000 ug | PREFILLED_SYRINGE | INTRAMUSCULAR | Status: DC | PRN
  Administered 2022-03-31: 50 ug via INTRAVENOUS

## 2022-03-31 MED ORDER — PHENYLEPHRINE 80 MCG/ML (10ML) SYRINGE FOR IV PUSH (FOR BLOOD PRESSURE SUPPORT)
PREFILLED_SYRINGE | INTRAVENOUS | Status: AC
  Filled 2022-03-31: qty 10

## 2022-03-31 MED ORDER — "VISTASEAL 4 ML SINGLE DOSE KIT "
4.0000 mL | PACK | Freq: Once | CUTANEOUS | Status: AC
  Administered 2022-03-31: 4 mL via TOPICAL
  Filled 2022-03-31: qty 4

## 2022-03-31 MED ORDER — BUPIVACAINE LIPOSOME 1.3 % IJ SUSP
INTRAMUSCULAR | Status: DC | PRN
  Administered 2022-03-31: 20 mL

## 2022-03-31 MED ORDER — LACTATED RINGERS IV SOLN: INTRAVENOUS | Status: DC

## 2022-03-31 MED ORDER — PROPOFOL 10 MG/ML IV BOLUS
INTRAVENOUS | Status: DC | PRN
  Administered 2022-03-31: 200 mg via INTRAVENOUS

## 2022-03-31 MED ORDER — DOCUSATE SODIUM 100 MG PO CAPS: 100.0000 mg | ORAL_CAPSULE | Freq: Two times a day (BID) | ORAL | Status: DC

## 2022-03-31 MED ORDER — FOLIC ACID 1 MG PO TABS
1.0000 mg | ORAL_TABLET | Freq: Every day | ORAL | Status: DC
  Administered 2022-04-01: 1 mg via ORAL
  Filled 2022-03-31: qty 1

## 2022-03-31 MED ORDER — HYDROMORPHONE HCL 1 MG/ML IJ SOLN
0.2500 mg | INTRAMUSCULAR | Status: DC | PRN
  Administered 2022-03-31: 0.5 mg via INTRAVENOUS

## 2022-03-31 MED ORDER — CHLORHEXIDINE GLUCONATE 0.12 % MT SOLN
15.0000 mL | Freq: Once | OROMUCOSAL | Status: AC
  Administered 2022-03-31: 15 mL via OROMUCOSAL

## 2022-03-31 MED ORDER — LIDOCAINE 2% (20 MG/ML) 5 ML SYRINGE
INTRAMUSCULAR | Status: DC | PRN
  Administered 2022-03-31: 60 mg via INTRAVENOUS

## 2022-03-31 MED ORDER — LABETALOL HCL 5 MG/ML IV SOLN
INTRAVENOUS | Status: DC | PRN
  Administered 2022-03-31 (×3): 5 mg via INTRAVENOUS

## 2022-03-31 MED ORDER — HYDROMORPHONE HCL 1 MG/ML IJ SOLN: 0.5000 mg | INTRAMUSCULAR | Status: DC | PRN

## 2022-03-31 SURGICAL SUPPLY — 75 items
ADH SKN CLS APL DERMABOND .7 (GAUZE/BANDAGES/DRESSINGS) ×1
APL LAPSCP 35 DL APL RGD (MISCELLANEOUS) ×1
APL PRP STRL LF DISP 70% ISPRP (MISCELLANEOUS) ×1
APPLICATOR VISTASEAL 35 (MISCELLANEOUS) ×1 IMPLANT
BAG COUNTER SPONGE SURGICOUNT (BAG) IMPLANT
BAG SPNG CNTER NS LX DISP (BAG)
CHLORAPREP W/TINT 26 (MISCELLANEOUS) ×1 IMPLANT
CLIP LIGATING HEM O LOK PURPLE (MISCELLANEOUS) ×2 IMPLANT
CLIP LIGATING HEMO LOK XL GOLD (MISCELLANEOUS) IMPLANT
CLIP LIGATING HEMO O LOK GREEN (MISCELLANEOUS) ×1 IMPLANT
CLIP SUT LAPRA TY ABSORB (SUTURE) ×2 IMPLANT
COVER SURGICAL LIGHT HANDLE (MISCELLANEOUS) ×1 IMPLANT
COVER TIP SHEARS 8 DVNC (MISCELLANEOUS) ×1 IMPLANT
COVER TIP SHEARS 8MM DA VINCI (MISCELLANEOUS) ×1
CUTTER ECHEON FLEX ENDO 45 340 (ENDOMECHANICALS) IMPLANT
DERMABOND ADVANCED .7 DNX12 (GAUZE/BANDAGES/DRESSINGS) ×1 IMPLANT
DRAIN CHANNEL 15F RND FF 3/16 (WOUND CARE) IMPLANT
DRAPE ARM DVNC X/XI (DISPOSABLE) ×4 IMPLANT
DRAPE COLUMN DVNC XI (DISPOSABLE) ×1 IMPLANT
DRAPE DA VINCI XI ARM (DISPOSABLE) ×4
DRAPE DA VINCI XI COLUMN (DISPOSABLE) ×1
DRAPE INCISE IOBAN 66X45 STRL (DRAPES) ×1 IMPLANT
DRAPE SHEET LG 3/4 BI-LAMINATE (DRAPES) ×1 IMPLANT
ELECT PENCIL ROCKER SW 15FT (MISCELLANEOUS) ×1 IMPLANT
ELECT REM PT RETURN 15FT ADLT (MISCELLANEOUS) ×1 IMPLANT
EVACUATOR SILICONE 100CC (DRAIN) IMPLANT
GAUZE 4X4 16PLY ~~LOC~~+RFID DBL (SPONGE) IMPLANT
GLOVE BIO SURGEON STRL SZ 6.5 (GLOVE) ×1 IMPLANT
GLOVE BIOGEL PI IND STRL 8 (GLOVE) ×1 IMPLANT
GLOVE SURG LX STRL 7.5 STRW (GLOVE) ×2 IMPLANT
GOWN SRG XL LVL 4 BRTHBL STRL (GOWNS) ×1 IMPLANT
GOWN STRL NON-REIN XL LVL4 (GOWNS) ×1
GOWN STRL REUS W/ TWL XL LVL3 (GOWN DISPOSABLE) ×2 IMPLANT
GOWN STRL REUS W/TWL XL LVL3 (GOWN DISPOSABLE) ×2
HEMOSTAT SURGICEL 4X8 (HEMOSTASIS) ×1 IMPLANT
HOLDER FOLEY CATH W/STRAP (MISCELLANEOUS) ×1 IMPLANT
IRRIG SUCT STRYKERFLOW 2 WTIP (MISCELLANEOUS) ×1
IRRIGATION SUCT STRKRFLW 2 WTP (MISCELLANEOUS) ×1 IMPLANT
KIT BASIN OR (CUSTOM PROCEDURE TRAY) ×1 IMPLANT
KIT TURNOVER KIT A (KITS) IMPLANT
LOOP VESSEL MAXI BLUE (MISCELLANEOUS) IMPLANT
MARKER SKIN DUAL TIP RULER LAB (MISCELLANEOUS) ×1 IMPLANT
NDL INSUFFLATION 14GA 120MM (NEEDLE) ×1 IMPLANT
NEEDLE INSUFFLATION 14GA 120MM (NEEDLE) ×1 IMPLANT
PROTECTOR NERVE ULNAR (MISCELLANEOUS) ×2 IMPLANT
RELOAD STAPLE 45 2.6 WHT THIN (STAPLE) IMPLANT
SCISSORS LAP 5X45 EPIX DISP (ENDOMECHANICALS) ×1 IMPLANT
SEAL CANN UNIV 5-8 DVNC XI (MISCELLANEOUS) ×4 IMPLANT
SEAL XI 5MM-8MM UNIVERSAL (MISCELLANEOUS) ×4
SET TUBE SMOKE EVAC HIGH FLOW (TUBING) ×1 IMPLANT
SLEEVE ADV FIXATION 12X100MM (TROCAR) IMPLANT
SOLUTION ELECTROLUBE (MISCELLANEOUS) ×1 IMPLANT
SPIKE FLUID TRANSFER (MISCELLANEOUS) ×1 IMPLANT
STAPLE RELOAD 45 WHT (STAPLE) IMPLANT
STAPLE RELOAD 45MM WHITE (STAPLE)
SUT ETHILON 2 0 PSLX (SUTURE) IMPLANT
SUT MNCRL AB 4-0 PS2 18 (SUTURE) ×2 IMPLANT
SUT PDS AB 0 CT1 36 (SUTURE) IMPLANT
SUT PROLENE 4 0 RB 1 (SUTURE) ×1
SUT PROLENE 4-0 RB1 .5 CRCL 36 (SUTURE) IMPLANT
SUT SILK 2 0 SH (SUTURE) IMPLANT
SUT V-LOC BARB 180 2/0GR6 GS22 (SUTURE) ×1
SUT VIC AB 1 CT1 36 (SUTURE) ×4 IMPLANT
SUT VICRYL 0 UR6 27IN ABS (SUTURE) IMPLANT
SUT VLOC BARB 180 ABS3/0GR12 (SUTURE) ×2
SUTURE V-LC BRB 180 2/0GR6GS22 (SUTURE) IMPLANT
SUTURE VLOC BRB 180 ABS3/0GR12 (SUTURE) ×2 IMPLANT
SYS BAG RETRIEVAL 10MM (BASKET) ×1
SYSTEM BAG RETRIEVAL 10MM (BASKET) ×1 IMPLANT
TOWEL OR 17X26 10 PK STRL BLUE (TOWEL DISPOSABLE) ×1 IMPLANT
TRAY FOLEY MTR SLVR 16FR STAT (SET/KITS/TRAYS/PACK) ×1 IMPLANT
TRAY LAPAROSCOPIC (CUSTOM PROCEDURE TRAY) ×1 IMPLANT
TROCAR Z THREAD OPTICAL 12X100 (TROCAR) ×1 IMPLANT
TROCAR Z-THREAD OPTICAL 5X100M (TROCAR) IMPLANT
WATER STERILE IRR 1000ML POUR (IV SOLUTION) ×1 IMPLANT

## 2022-03-31 NOTE — Anesthesia Postprocedure Evaluation (Signed)
Anesthesia Post Note  Patient: Vincent Watkins  Procedure(s) Performed: XI ROBOTIC ASSITED PARTIAL NEPHRECTOMY (Right: Abdomen)     Patient location during evaluation: PACU Anesthesia Type: General Level of consciousness: awake and alert Pain management: pain level controlled Vital Signs Assessment: post-procedure vital signs reviewed and stable Respiratory status: spontaneous breathing, nonlabored ventilation, respiratory function stable and patient connected to nasal cannula oxygen Cardiovascular status: blood pressure returned to baseline and stable Postop Assessment: no apparent nausea or vomiting Anesthetic complications: no   No notable events documented.  Last Vitals:  Vitals:   03/31/22 1515 03/31/22 1534  BP:  (!) 138/99  Pulse:  90  Resp:  20  Temp: 36.6 C 36.6 C  SpO2:  97%    Last Pain:  Vitals:   03/31/22 1534  TempSrc: Oral  PainSc:                  Tiajuana Amass

## 2022-03-31 NOTE — Discharge Instructions (Signed)

## 2022-03-31 NOTE — Op Note (Signed)
Operative Note  Preoperative diagnosis:  1.  3.1 cm Bosniak 3 right renal mass  Postoperative diagnosis: 1.  Same  Procedure(s): 1.  Robot-assisted laparoscopic right partial nephrectomy 2.  Intraoperative ultrasound of single retroperitoneal organ  Surgeon: Ellison Hughs, MD  Assistants: Debbrah Alar, PA-C  An assistant was required for this surgical procedure.  The duties of the assistant included but were not limited to suctioning, passing suture, camera manipulation, retraction.  This procedure would not be able to be performed without an Environmental consultant.   Anesthesia:  General  Complications:  1.  5 mm serosal tear in the ascending colon 2.  2 mm cavotomy   EBL:  300 mL   Specimens: 1.  Right renal mass  Drains/Catheters: 1.  Foley catheter 2.  Right lower quadrant JP drain  Intraoperative findings:   5 mm serosal tear in the ascending colon that was repaired with an imbricating 2-0 silk suture 2 mm cavotomy that was repaired with a 4-0 Prolene in a figure-of-eight fashion 3 accessory right renal veins and early branching of the right renal artery Grossly negative surgical margins following excision of right renal mass Right renorrhaphy was hemostatic at the conclusion of the case  Indication:  Vincent Watkins is a 51 y.o. male with a 3.1 cm Bosniak 3 right renal cyst.  He has been consented for the above procedures, voices understanding and wishes to proceed.  Description of procedure:  After informed consent was signed, the patient was taken back to the operating room and properly anesthetized.  The patient was then placed in the left lateral decubitus position with all pressure points padded.  The abdomen was then prepped and draped in the usual sterile fashion.  A time-out was then performed.    An 8 mm incision was then made lateral to the right rectus muscle at the level of the right 12th rib.  A Veress needle was then used to access the abdominal cavity.  A saline  drop test showed no signs of obstruction and aspiration of the Veress needle revealed no blood or sucus.  The abdominal cavity was then insufflated to 15 mmHg.  An 8 mm robotic trocar was then atraumatically inserted into the abdominal cavity.  The robotic camera was then inserted through the port and inspection of the abdominal cavity revealed no bowel or vessel injury we then placed three additional 8 mm robotic ports and a 12 mm assistant port in such as fashion as to triangulate the right renal hilum.  Following placement of the robotic ports, a 5 mm serosal violation was made in the ascending colon while trying to lyse sidewall adhesions sharply with laparoscopic scissors.  The robot was then docked into postion.  The serosal tear in the ascending colon was repaired with an imbricating 2-0 silk suture.  Using a combination of blunt and cold scissors dissection, the hepatic attachments were released from the abdominal sidewall.  The white line of Toldt along the ascending colon was then incised, allowing [place] to reflect the colon medially and expose the anterior surface of the right kidney.  The duodenum was then Kocherized medially, which abruptly led [place] to identification of the inferior vena cava.    Once the colon was adequately mobilized, we moved to the lower pole and identified the gonadal vein and ureter.  The gonadal vein was then left running parallel to the vena cava and the right ureter was reflected anteriorly.  Using cautious cautery, the overlying perihilar attachments were then released.  This yielded visualization of the renal hilum, which included a 3 right renal veins and early branching of renal artery.  The perilymphatic tissue surrounding the right renal artery were carefully released so that the right renal artery was fully encircled.  Although resecting the perihilar attachments away, a 2 mm cavotomy was made.  The cavotomy was repaired with a 4-0 Prolene suture in a figure-of-eight  fashion with minimal blood loss.    We next turned our attention to defatting the kidney.  An anterior incision along Gerota's fascia was created and the kidney was fully mobilized.   The renal mass is identified at the lower, lateral pole.  Using intraoperative ultrasound, the tumor was then carefully evaluated and demonstrated heterogenous echogenicity compared to the rest of the renal parenchyma. The resection margin was marked to allow for wide excision of the renal mass.  The renal hilum was once again identified and a bulldog clamp was placed.    Using cold scissors, the right renal mass was then carefully excised, leaving a grossly negative margin.  Excision of the mass appeared complete with no tumor grossly remaining.  The tumor was then placed in the right lower quadrant, to be retrieved following repair of the renal defect.  A running 3-0 V lock suture was then used to reapproximate the resection bed.  Tension was placed with hemo-lock clips.   The right renal capsule was then reapproximated using a 0 Vicryl suture on a CT-1 needle in an interrupted fashion using hemo-lock clips as a buttress.  Lapra-Ty's were then placed on each of the interrupted sutures.  The bulldog was then released, which warm ischemia time totaled 20 minutes.   Once hemostasis was achieved and the renal bed was irrigated, Surgicel and Vistaseal was then placed over the renorrhaphy.  Gerota's fascia was then reapproximated with a running v-lok suture and the mesocolonic fat along the descending colon was then reapproximated to the right abdominal sidewall using Hem-o-lok clips.  The renal mass was retrieved and placed in an Endo Catch bag.  A right lower quadrant JP drain was placed through the right lower quadrant robotic port.  The mass was extracted through the 12 mm assistant port.  The fascia of the assistant port was then reapproximated with a 0 Vicryl suture.    The abdomen was desufflated with all ports removed.  The  skin was then reapproximated using running Monocryl and dressed appropriately.  The patient was then replaced in the supine position and was awakened from anesthesia without complications.  Plan: Monitor on the floor overnight

## 2022-03-31 NOTE — Anesthesia Preprocedure Evaluation (Signed)
Anesthesia Evaluation  Patient identified by MRN, date of birth, ID band Patient awake    Reviewed: Allergy & Precautions, NPO status , Patient's Chart, lab work & pertinent test results  Airway Mallampati: III  TM Distance: >3 FB Neck ROM: Full    Dental   Pulmonary Current Smoker and Patient abstained from smoking.   breath sounds clear to auscultation       Cardiovascular hypertension, Pt. on medications  Rhythm:Regular Rate:Normal     Neuro/Psych negative neurological ROS     GI/Hepatic negative GI ROS, Neg liver ROS,,,  Endo/Other  negative endocrine ROS    Renal/GU Renal disease     Musculoskeletal   Abdominal   Peds  Hematology negative hematology ROS (+)   Anesthesia Other Findings   Reproductive/Obstetrics                              Lab Results  Component Value Date   WBC 4.7 03/23/2022   HGB 15.8 03/23/2022   HCT 47.0 03/23/2022   MCV 105.1 (H) 03/23/2022   PLT 135 (L) 03/23/2022   Lab Results  Component Value Date   CREATININE 0.88 03/23/2022   BUN 18 03/23/2022   NA 136 03/23/2022   K 4.5 03/23/2022   CL 101 03/23/2022   CO2 25 03/23/2022    Anesthesia Physical Anesthesia Plan  ASA: 3  Anesthesia Plan: General   Post-op Pain Management: Tylenol PO (pre-op)*   Induction: Intravenous  PONV Risk Score and Plan: 1 and Dexamethasone and Ondansetron  Airway Management Planned: Oral ETT  Additional Equipment:   Intra-op Plan:   Post-operative Plan: Extubation in OR  Informed Consent: I have reviewed the patients History and Physical, chart, labs and discussed the procedure including the risks, benefits and alternatives for the proposed anesthesia with the patient or authorized representative who has indicated his/her understanding and acceptance.     Dental advisory given  Plan Discussed with: CRNA  Anesthesia Plan Comments:           Anesthesia Quick Evaluation

## 2022-03-31 NOTE — Anesthesia Procedure Notes (Signed)
Procedure Name: Intubation Date/Time: 03/31/2022 7:43 AM  Performed by: Eben Burow, CRNAPre-anesthesia Checklist: Patient identified, Emergency Drugs available, Suction available, Patient being monitored and Timeout performed Patient Re-evaluated:Patient Re-evaluated prior to induction Oxygen Delivery Method: Circle system utilized Preoxygenation: Pre-oxygenation with 100% oxygen Induction Type: IV induction Ventilation: Two handed mask ventilation required Laryngoscope Size: Mac and 4 Grade View: Grade I Tube type: Oral Tube size: 7.5 mm Number of attempts: 1 Airway Equipment and Method: Stylet Placement Confirmation: ETT inserted through vocal cords under direct vision, positive ETCO2 and breath sounds checked- equal and bilateral Secured at: 23 cm Tube secured with: Tape Dental Injury: Teeth and Oropharynx as per pre-operative assessment

## 2022-03-31 NOTE — Transfer of Care (Signed)
Immediate Anesthesia Transfer of Care Note  Patient: Vincent Watkins  Procedure(s) Performed: XI ROBOTIC ASSITED PARTIAL NEPHRECTOMY (Right: Abdomen)  Patient Location: PACU  Anesthesia Type:General  Level of Consciousness: awake, alert , and patient cooperative  Airway & Oxygen Therapy: Patient Spontanous Breathing and Patient connected to face mask oxygen  Post-op Assessment: Report given to RN and Post -op Vital signs reviewed and stable  Post vital signs: Reviewed and stable  Last Vitals:  Vitals Value Taken Time  BP 158/92 03/31/22 1120  Temp    Pulse 98 03/31/22 1122  Resp 21 03/31/22 1122  SpO2 100 % 03/31/22 1122  Vitals shown include unvalidated device data.  Last Pain:  Vitals:   03/31/22 0548  TempSrc:   PainSc: 0-No pain         Complications: No notable events documented.

## 2022-04-01 ENCOUNTER — Encounter (HOSPITAL_COMMUNITY): Payer: Self-pay | Admitting: Urology

## 2022-04-01 ENCOUNTER — Other Ambulatory Visit (HOSPITAL_COMMUNITY): Payer: Self-pay

## 2022-04-01 LAB — BASIC METABOLIC PANEL
Anion gap: 8 (ref 5–15)
BUN: 19 mg/dL (ref 6–20)
CO2: 23 mmol/L (ref 22–32)
Calcium: 8.6 mg/dL — ABNORMAL LOW (ref 8.9–10.3)
Chloride: 103 mmol/L (ref 98–111)
Creatinine, Ser: 1.03 mg/dL (ref 0.61–1.24)
GFR, Estimated: >60 mL/min (ref >60)
Glucose, Bld: 148 mg/dL — ABNORMAL HIGH (ref 70–99)
Potassium: 4.3 mmol/L (ref 3.5–5.1)
Sodium: 134 mmol/L — ABNORMAL LOW (ref 135–145)

## 2022-04-01 LAB — CREATININE, FLUID (PLEURAL, PERITONEAL, JP DRAINAGE): Creat, Fluid: 1 mg/dL

## 2022-04-01 LAB — HEMOGLOBIN AND HEMATOCRIT, BLOOD
HCT: 41 % (ref 39.0–52.0)
Hemoglobin: 13.7 g/dL (ref 13.0–17.0)

## 2022-04-01 MED ORDER — BISACODYL 10 MG RE SUPP
10.0000 mg | Freq: Once | RECTAL | Status: AC
  Administered 2022-04-01: 10 mg via RECTAL
  Filled 2022-04-01: qty 1

## 2022-04-01 MED ORDER — OXYCODONE HCL 5 MG PO TABS
5.0000 mg | ORAL_TABLET | Freq: Three times a day (TID) | ORAL | 0 refills | Status: AC | PRN
  Filled 2022-04-01: qty 8, 3d supply, fill #0

## 2022-04-01 MED ORDER — OXYCODONE HCL 5 MG PO TABS: 5.0000 mg | ORAL_TABLET | Freq: Three times a day (TID) | ORAL | 0 refills | Status: AC | PRN

## 2022-04-01 NOTE — TOC Initial Note (Signed)
Transition of Care [facility]) - Initial/Assessment Note    Patient Details  Name: Samarion Ehle MRN: [id number] Date of Birth: 1971/01/28  Transition of Care [facility]) CM/SW Contact:    Leeroy Cha, RN Phone Number: 04/01/2022, 8:22 AM  Clinical Narrative:                  Transition of Care [facility]) Screening Note   Patient Details  Name: Sherlock Nancarrow Date of Birth: 11-23-1970   Transition of Care [facility]) CM/SW Contact:    Leeroy Cha, RN Phone Number: 04/01/2022, 8:22 AM    Transition of Care Department [facility]) has reviewed patient and no TOC needs have been identified at this time. We will continue to monitor patient advancement through interdisciplinary progression rounds. If new patient transition needs arise, please place a TOC consult.    Expected Discharge Plan: Home/Self Care Barriers to Discharge: Continued Medical Work up   Patient Goals and CMS Choice Patient states their goals for this hospitalization and ongoing recovery are:: to go home      Expected Discharge Plan and Services Expected Discharge Plan: Home/Self Care   Discharge Planning Services: CM Consult   Living arrangements for the past 2 months: Single Family Home                                      Prior Living Arrangements/Services Living arrangements for the past 2 months: Single Family Home Lives with:: Self Patient language and need for interpreter reviewed:: Yes Do you feel safe going back to the place where you live?: Yes            Criminal Activity/Legal Involvement Pertinent to Current Situation/Hospitalization: No - Comment as needed  Activities of Daily Living Home Assistive Devices/Equipment: None ADL Screening (condition at time of admission) Patient's cognitive ability adequate to safely complete daily activities?: Yes Is the patient deaf or have difficulty hearing?: No Does the patient have difficulty seeing, even when wearing glasses/contacts?: No Does the  patient have difficulty concentrating, remembering, or making decisions?: No Patient able to express need for assistance with ADLs?: Yes Does the patient have difficulty dressing or bathing?: No Independently performs ADLs?: Yes (appropriate for developmental age) Does the patient have difficulty walking or climbing stairs?: No Weakness of Legs: None Weakness of Arms/Hands: None  Permission Sought/Granted                  Emotional Assessment Appearance:: Appears stated age Attitude/Demeanor/Rapport: Engaged Affect (typically observed): Calm Orientation: : Oriented to Self, Oriented to Place, Oriented to  Time, Oriented to Situation Alcohol / Substance Use: Not Applicable Psych Involvement: No (comment)  Admission diagnosis:  Renal mass [N28.89] Patient Active Problem List   Diagnosis Date Noted   Renal mass 03/31/2022   Left rib fracture 03/19/2020   Pleural effusion 03/19/2020   Diabetes ([place]) 03/19/2020   Pneumonia 03/18/2020   Pleuritis 03/18/2020   Paresthesia 02/05/2016   BMI 28.0-28.9,adult 10/25/2015   Smoker    PCP:  Leonard Downing, MD Pharmacy:   [place], [address] [place] [address] Phone: [phone] Fax: [phone]  [id number] [id number]: [phone]: [phone]    Social Determinants of Health (SDOH) Interventions    Readmission Risk Interventions  No data to display

## 2022-04-01 NOTE — Progress Notes (Signed)
Mobility Specialist - Progress Note   04/01/22 0942  Mobility  Activity Ambulated independently to bathroom;Ambulated independently in hallway  Level of Assistance Independent  Assistive Device None  Distance Ambulated (ft) 1400 ft  Range of Motion/Exercises Active  Activity Response Tolerated well  Mobility Referral Yes  $Mobility charge 1 Mobility   Pt was found getting up from bed and ambulating to bathroom. Afterwards was agreeable to ambulate in hallway. Had no complaints during session and at EOS returned to recliner chair with wife in room.   Ferd Hibbs Mobility Specialist

## 2022-04-01 NOTE — Plan of Care (Signed)
Discharge instructions reviewed with patient and significant other, questions answered, verbalized understanding. Patient ambulatory to main entrance to be taken home by significant other.

## 2022-04-01 NOTE — Discharge Summary (Signed)
  Date of admission: 03/31/2022  Date of discharge: 04/01/2022  Admission diagnosis: Right renal mass  Discharge diagnosis: Right renal mass  Secondary diagnoses: Alcohol abuse, hypertension  History and Physical: For full details, please see admission history and physical. Briefly, Vincent Watkins is a 51 y.o. year old patient with with right renal mass.   Hospital Course: He was taken the operating room and underwent a right robot-assisted laparoscopic partial nephrectomy in 03/31/2022.  Intraoperatively, there was a small serosal tear in the colon which was identified and repaired primarily.  There was also a small cavotomy in the inferior vena cava that was repaired.  His surgery was otherwise uneventful.  Postoperatively, he was maintained on bedrest.  He was maintained on withdrawal prophylaxis due to his history of alcohol abuse.  He remained stable overnight and was able to begin ambulating on postoperative day 1.  His diet was advanced.  His drain creatinine was consistent with serum and was removed.  He was able to be managed with oral pain medication adequately.  He was stable for discharge on the evening of postoperative day 1.  Laboratory values:  Recent Labs    03/31/22 1213 04/01/22 0424  HGB 14.5 13.7  HCT 43.5 41.0   Recent Labs    04/01/22 0424  CREATININE 1.03    Disposition: Home  Discharge instruction: The patient was instructed to be ambulatory but told to refrain from heavy lifting, strenuous activity, or driving.   Discharge medications:  Allergies as of 04/01/2022   No Known Allergies      Medication List     TAKE these medications    cetirizine 10 MG tablet Commonly known as: ZYRTEC Take 1 tablet (10 mg total) by mouth daily.   docusate sodium 100 MG capsule Commonly known as: COLACE Take 1 capsule (100 mg total) by mouth 2 (two) times daily.   doxycycline 100 MG capsule Commonly known as: VIBRAMYCIN Take 1 capsule (100 mg total) by mouth 2  (two) times daily.   DULoxetine 60 MG capsule Commonly known as: CYMBALTA Take 60 mg by mouth daily.   folic acid 1 MG tablet Commonly known as: FOLVITE Take 1 mg by mouth daily.   HYDROcodone-acetaminophen 5-325 MG tablet Commonly known as: Norco Take 1-2 tablets by mouth every 6 (six) hours as needed for moderate pain or severe pain.   lisinopril 20 MG tablet Commonly known as: ZESTRIL Take 20 mg by mouth daily.        Followup:   Follow-up Information     Ceasar Mons, MD Follow up on 04/17/2022.   Specialty: Urology Why: at 9:45 Contact information: [address] [place]  [address] [phone]

## 2022-04-01 NOTE — Plan of Care (Signed)

## 2022-04-01 NOTE — Progress Notes (Signed)
Patient ID: Vincent Watkins, male   DOB: 07-20-1970, 51 y.o.   MRN: [id number]  1 Day Post-Op Subjective: Pt without complaints overnight.  Pain controlled.  No events.  Objective: Vital signs in last 24 hours: Temp:  [97.7 F (36.5 C)-98 F (36.7 C)] 97.8 F (36.6 C) (11/22 0453) Pulse Rate:  [81-99] 90 (11/22 0453) Resp:  [12-26] 19 (11/22 0453) BP: (126-163)/(83-101) 129/89 (11/22 0453) SpO2:  [89 %-100 %] 98 % (11/22 0453) Weight:  [92.4 kg] 92.4 kg (11/22 0453)  Intake/Output from previous day: 11/21 0701 - 11/22 0700 In: 2578.5 [P.O.:120; I.V.:1720; IV Piggyback:738.5] Out: 2860 [Urine:2350; Drains:210; Blood:300] Intake/Output this shift: No intake/output data recorded.  Physical Exam:  General: Alert and oriented CV: RRR Lungs: Clear Abdomen: Soft, ND, positive bowel sounds, drain with minimal serosanguinous drainage Incisions: C/D/I Ext: NT, No erythema  Lab Results: Recent Labs    03/31/22 1213 04/01/22 0424  HGB 14.5 13.7  HCT 43.5 41.0   BMET Recent Labs    04/01/22 0424  NA 134*  K 4.3  CL 103  CO2 23  GLUCOSE 148*  BUN 19  CREATININE 1.03  CALCIUM 8.6*     Studies/Results: No results found.  Assessment/Plan: POD # 1 s/p right RAL partial nephrectomy - Ambulate, IS, DVT prophylaxis - D/C Foley - Check drain Cr - Advance diet - Oral pain medication - Decrease IVF - DT prophylaxis - Reassess later today for possible discharge    LOS: 1 day   Dutch Gray 04/01/2022, 7:16 AM

## 2022-04-02 ENCOUNTER — Other Ambulatory Visit (HOSPITAL_COMMUNITY): Payer: Self-pay

## 2022-04-03 ENCOUNTER — Other Ambulatory Visit (HOSPITAL_COMMUNITY): Payer: Self-pay

## 2022-04-03 ENCOUNTER — Ambulatory Visit: Payer: Self-pay | Admitting: *Deleted

## 2022-04-03 LAB — SURGICAL PATHOLOGY

## 2022-04-03 NOTE — Telephone Encounter (Signed)
Reason for Disposition  [1] Caller has URGENT medicine question about med that PCP or specialist prescribed AND [2] triager unable to answer question    Instructed to call his dr., call a pharmacist or go to the ED.  Answer Assessment - Initial Assessment Questions 1. NAME of MEDICINE: "What medicine(s) are you calling about?"     The oxycodone 5 mg is not helping.   I have plenty of pills I can take.   I can take 2 pills but it will wipe out my supply of pills.   I only got 8 pills when I got out of the hospital.     2. QUESTION: "What is your question?" (e.g., double dose of medicine, side effect)     If I want to mix my medications with my oxycodone what like the Tramadol or Morphine what will that do?   Can I do that?   How much can I take? He repeatedly kept asking me what he could mix, how much, etc.  He became angry with me because I let him know I could not advise on medications and mixing them especially pain killers like that.  He kept on trying to get me to tell him what he could mix together that he had plenty of pills he could mix just let him know what he could mix and how much to help with the pain. I finally had to tell him I needed to go because he kept wanting me to tell him what he could mix.   I suggested he call his dr. Or a pharmacist but that I could not advise him on how much medication he could take and/or mix. I ended the call at this point.  3. PRESCRIBER: "Who prescribed the medicine?" Reason: if prescribed by specialist, call should be referred to that group.     Dr. In the hospital 4. SYMPTOMS: "Do you have any symptoms?" If Yes, ask: "What symptoms are you having?"  "How bad are the symptoms (e.g., mild, moderate, severe)     Having pain 5. PREGNANCY:  "Is there any chance that you are pregnant?" "When was your last menstrual period?"     N/A  Protocols used: Medication Question Call-A-AH

## 2022-04-06 ENCOUNTER — Other Ambulatory Visit (HOSPITAL_COMMUNITY): Payer: Self-pay

## 2022-08-24 ENCOUNTER — Other Ambulatory Visit (HOSPITAL_COMMUNITY): Payer: Self-pay | Admitting: Urology

## 2022-08-24 ENCOUNTER — Ambulatory Visit (HOSPITAL_COMMUNITY)
Admission: RE | Admit: 2022-08-24 | Discharge: 2022-08-24 | Disposition: A | Discharge status: Home / Self Care | Payer: BC Managed Care – PPO | Source: Ambulatory Visit | Attending: Urology | Admitting: Urology

## 2022-08-24 DIAGNOSIS — Z85528 Personal history of other malignant neoplasm of kidney: Secondary | ICD-10-CM

## 2022-12-02 ENCOUNTER — Other Ambulatory Visit: Payer: Self-pay | Admitting: Family Medicine

## 2022-12-02 ENCOUNTER — Ambulatory Visit
Admission: RE | Admit: 2022-12-02 | Discharge: 2022-12-02 | Disposition: A | Discharge status: Home / Self Care | Payer: BC Managed Care – PPO | Source: Ambulatory Visit | Attending: Family Medicine | Admitting: Family Medicine

## 2022-12-02 DIAGNOSIS — T1490XA Injury, unspecified, initial encounter: Secondary | ICD-10-CM

## 2022-12-27 ENCOUNTER — Telehealth (HOSPITAL_COMMUNITY): Payer: Self-pay | Admitting: Emergency Medicine

## 2022-12-27 ENCOUNTER — Emergency Department (HOSPITAL_COMMUNITY): Payer: BC Managed Care – PPO

## 2022-12-27 ENCOUNTER — Emergency Department (HOSPITAL_COMMUNITY)
Admission: EM | Admit: 2022-12-27 | Discharge: 2022-12-28 | Disposition: A | Discharge status: Home / Self Care | Payer: BC Managed Care – PPO | Attending: Emergency Medicine | Admitting: Emergency Medicine

## 2022-12-27 ENCOUNTER — Encounter (HOSPITAL_COMMUNITY): Payer: Self-pay

## 2022-12-27 ENCOUNTER — Other Ambulatory Visit: Payer: Self-pay

## 2022-12-27 ENCOUNTER — Emergency Department (HOSPITAL_COMMUNITY)
Admission: EM | Admit: 2022-12-27 | Discharge: 2022-12-27 | Discharge status: Against Medical Advice | Payer: BC Managed Care – PPO | Source: Home / Self Care | Attending: Emergency Medicine | Admitting: Emergency Medicine

## 2022-12-27 DIAGNOSIS — S91032A Puncture wound without foreign body, left ankle, initial encounter: Secondary | ICD-10-CM | POA: Insufficient documentation

## 2022-12-27 DIAGNOSIS — F17200 Nicotine dependence, unspecified, uncomplicated: Secondary | ICD-10-CM | POA: Diagnosis not present

## 2022-12-27 DIAGNOSIS — S60512A Abrasion of left hand, initial encounter: Secondary | ICD-10-CM | POA: Insufficient documentation

## 2022-12-27 DIAGNOSIS — M7022 Olecranon bursitis, left elbow: Secondary | ICD-10-CM | POA: Insufficient documentation

## 2022-12-27 DIAGNOSIS — Z79899 Other long term (current) drug therapy: Secondary | ICD-10-CM | POA: Diagnosis not present

## 2022-12-27 DIAGNOSIS — Z23 Encounter for immunization: Secondary | ICD-10-CM | POA: Insufficient documentation

## 2022-12-27 DIAGNOSIS — Z5329 Procedure and treatment not carried out because of patient's decision for other reasons: Secondary | ICD-10-CM | POA: Insufficient documentation

## 2022-12-27 DIAGNOSIS — I129 Hypertensive chronic kidney disease with stage 1 through stage 4 chronic kidney disease, or unspecified chronic kidney disease: Secondary | ICD-10-CM | POA: Insufficient documentation

## 2022-12-27 DIAGNOSIS — S40811A Abrasion of right upper arm, initial encounter: Secondary | ICD-10-CM | POA: Diagnosis not present

## 2022-12-27 DIAGNOSIS — S81802A Unspecified open wound, left lower leg, initial encounter: Secondary | ICD-10-CM | POA: Insufficient documentation

## 2022-12-27 DIAGNOSIS — Y939 Activity, unspecified: Secondary | ICD-10-CM | POA: Insufficient documentation

## 2022-12-27 DIAGNOSIS — W3400XA Accidental discharge from unspecified firearms or gun, initial encounter: Secondary | ICD-10-CM | POA: Insufficient documentation

## 2022-12-27 DIAGNOSIS — S99912A Unspecified injury of left ankle, initial encounter: Secondary | ICD-10-CM | POA: Diagnosis present

## 2022-12-27 DIAGNOSIS — S81801A Unspecified open wound, right lower leg, initial encounter: Secondary | ICD-10-CM | POA: Insufficient documentation

## 2022-12-27 DIAGNOSIS — N189 Chronic kidney disease, unspecified: Secondary | ICD-10-CM | POA: Insufficient documentation

## 2022-12-27 DIAGNOSIS — G629 Polyneuropathy, unspecified: Secondary | ICD-10-CM | POA: Diagnosis not present

## 2022-12-27 DIAGNOSIS — F1010 Alcohol abuse, uncomplicated: Secondary | ICD-10-CM | POA: Diagnosis not present

## 2022-12-27 LAB — URINALYSIS, ROUTINE W REFLEX MICROSCOPIC
Bilirubin Urine: NEGATIVE
Glucose, UA: 50 mg/dL — AB
Hgb urine dipstick: NEGATIVE
Ketones, ur: NEGATIVE mg/dL
Leukocytes,Ua: NEGATIVE
Nitrite: NEGATIVE
Protein, ur: NEGATIVE mg/dL
Specific Gravity, Urine: 1.005 (ref 1.005–1.030)
pH: 6 (ref 5.0–8.0)

## 2022-12-27 LAB — COMPREHENSIVE METABOLIC PANEL
ALT: 23 U/L (ref 0–44)
AST: 24 U/L (ref 15–41)
Albumin: 3.3 g/dL — ABNORMAL LOW (ref 3.5–5.0)
Alkaline Phosphatase: 63 U/L (ref 38–126)
Anion gap: 11 (ref 5–15)
BUN: 12 mg/dL (ref 6–20)
CO2: 20 mmol/L — ABNORMAL LOW (ref 22–32)
Calcium: 8.6 mg/dL — ABNORMAL LOW (ref 8.9–10.3)
Chloride: 108 mmol/L (ref 98–111)
Creatinine, Ser: 0.91 mg/dL (ref 0.61–1.24)
GFR, Estimated: >60 mL/min (ref >60)
Glucose, Bld: 113 mg/dL — ABNORMAL HIGH (ref 70–99)
Potassium: 3.5 mmol/L (ref 3.5–5.1)
Sodium: 139 mmol/L (ref 135–145)
Total Bilirubin: 0.5 mg/dL (ref 0.3–1.2)
Total Protein: 6.5 g/dL (ref 6.5–8.1)

## 2022-12-27 LAB — ETHANOL: Alcohol, Ethyl (B): 304 mg/dL (ref <10)

## 2022-12-27 LAB — TYPE AND SCREEN
ABO/RH(D): B POS
Antibody Screen: NEGATIVE
Unit division: 0
Unit division: 0

## 2022-12-27 LAB — BPAM RBC
Blood Product Expiration Date: 202408272359
Blood Product Expiration Date: 202409032359
ISSUE DATE / TIME: 202408181537
ISSUE DATE / TIME: 202408181537
Unit Type and Rh: 5100
Unit Type and Rh: 5100

## 2022-12-27 LAB — I-STAT CHEM 8, ED
BUN: 14 mg/dL (ref 6–20)
Calcium, Ion: 1.08 mmol/L — ABNORMAL LOW (ref 1.15–1.40)
Chloride: 108 mmol/L (ref 98–111)
Creatinine, Ser: 1.2 mg/dL (ref 0.61–1.24)
Glucose, Bld: 112 mg/dL — ABNORMAL HIGH (ref 70–99)
HCT: 33 % — ABNORMAL LOW (ref 39.0–52.0)
Hemoglobin: 11.2 g/dL — ABNORMAL LOW (ref 13.0–17.0)
Potassium: 3.7 mmol/L (ref 3.5–5.1)
Sodium: 141 mmol/L (ref 135–145)
TCO2: 21 mmol/L — ABNORMAL LOW (ref 22–32)

## 2022-12-27 LAB — I-STAT CG4 LACTIC ACID, ED: Lactic Acid, Venous: 1.2 mmol/L (ref 0.5–1.9)

## 2022-12-27 LAB — PREPARE FRESH FROZEN PLASMA
Unit division: 0
Unit division: 0

## 2022-12-27 LAB — RAPID URINE DRUG SCREEN, HOSP PERFORMED
Amphetamines: NOT DETECTED
Barbiturates: NOT DETECTED
Benzodiazepines: NOT DETECTED
Cocaine: NOT DETECTED
Opiates: POSITIVE — AB
Tetrahydrocannabinol: NOT DETECTED

## 2022-12-27 LAB — CBC
HCT: 31.5 % — ABNORMAL LOW (ref 39.0–52.0)
Hemoglobin: 9.9 g/dL — ABNORMAL LOW (ref 13.0–17.0)
MCH: 30.5 pg (ref 26.0–34.0)
MCHC: 31.4 g/dL (ref 30.0–36.0)
MCV: 96.9 fL (ref 80.0–100.0)
Platelets: 135 10*3/uL — ABNORMAL LOW (ref 150–400)
RBC: 3.25 MIL/uL — ABNORMAL LOW (ref 4.22–5.81)
RDW: 15.3 % (ref 11.5–15.5)
WBC: 6 10*3/uL (ref 4.0–10.5)
nRBC: 0 % (ref 0.0–0.2)

## 2022-12-27 LAB — ABO/RH: ABO/RH(D): B POS

## 2022-12-27 LAB — PROTIME-INR
INR: 1 (ref 0.8–1.2)
Prothrombin Time: 13.4 s (ref 11.4–15.2)

## 2022-12-27 LAB — BPAM FFP
Blood Product Expiration Date: 202408182359
Blood Product Expiration Date: 202408182359
ISSUE DATE / TIME: 202408181538
ISSUE DATE / TIME: 202408181538
Unit Type and Rh: 6200
Unit Type and Rh: 6200

## 2022-12-27 MED ORDER — SODIUM CHLORIDE 0.9 % IV BOLUS
1000.0000 mL | Freq: Once | INTRAVENOUS | Status: AC
  Administered 2022-12-27: 1000 mL via INTRAVENOUS

## 2022-12-27 MED ORDER — TETANUS-DIPHTH-ACELL PERTUSSIS 5-2.5-18.5 LF-MCG/0.5 IM SUSY
0.5000 mL | PREFILLED_SYRINGE | Freq: Once | INTRAMUSCULAR | Status: AC
  Administered 2022-12-27: 0.5 mL via INTRAMUSCULAR

## 2022-12-27 NOTE — ED Notes (Addendum)
Pt refused to wear cardiac monitor and pulse ox and B/P. Pt stated he is ready to leave and was attempting to remove IV placed in arm, pt was redirected by RN.

## 2022-12-27 NOTE — ED Notes (Signed)
Pt significant other at bedside

## 2022-12-27 NOTE — ED Notes (Signed)
Pt departed ED with significant other. Doctor laurenzo was informed.
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
## 2022-12-27 NOTE — ED Notes (Signed)
Pt observed attempting to leave ED. This author confronted pt and attempted to convince the pt to stay, pt refused. Confirmed with pt that both IV's were removed prior to departure.

## 2022-12-27 NOTE — ED Notes (Signed)
Pt refusing to be placed on any monitoring equipment
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
## 2022-12-27 NOTE — ED Notes (Signed)
Patients left ankle and foot have been cleaned with wound cleaner and normal saline.

## 2022-12-28 ENCOUNTER — Encounter (HOSPITAL_COMMUNITY): Payer: Self-pay

## 2022-12-28 MED ORDER — AMOXICILLIN 500 MG PO CAPS: 500.0000 mg | ORAL_CAPSULE | Freq: Three times a day (TID) | ORAL | 0 refills | Status: AC

## 2022-12-28 MED ORDER — DOXYCYCLINE HYCLATE 100 MG PO TABS
100.0000 mg | ORAL_TABLET | Freq: Once | ORAL | Status: AC
  Administered 2022-12-28: 100 mg via ORAL
  Filled 2022-12-28: qty 1

## 2022-12-28 MED ORDER — DOXYCYCLINE HYCLATE 100 MG PO CAPS: 100.0000 mg | ORAL_CAPSULE | Freq: Two times a day (BID) | ORAL | 0 refills | Status: DC
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
## 2023-04-13 ENCOUNTER — Observation Stay (HOSPITAL_COMMUNITY)
Admission: EM | Admit: 2023-04-13 | Discharge: 2023-04-15 | Discharge status: Against Medical Advice | Payer: BC Managed Care – PPO | Attending: Internal Medicine | Admitting: Internal Medicine

## 2023-04-13 ENCOUNTER — Emergency Department (HOSPITAL_COMMUNITY): Payer: BC Managed Care – PPO

## 2023-04-13 DIAGNOSIS — F10920 Alcohol use, unspecified with intoxication, uncomplicated: Secondary | ICD-10-CM

## 2023-04-13 DIAGNOSIS — D696 Thrombocytopenia, unspecified: Secondary | ICD-10-CM | POA: Insufficient documentation

## 2023-04-13 DIAGNOSIS — F10929 Alcohol use, unspecified with intoxication, unspecified: Secondary | ICD-10-CM | POA: Insufficient documentation

## 2023-04-13 DIAGNOSIS — R079 Chest pain, unspecified: Secondary | ICD-10-CM | POA: Diagnosis present

## 2023-04-13 DIAGNOSIS — F17210 Nicotine dependence, cigarettes, uncomplicated: Secondary | ICD-10-CM

## 2023-04-13 DIAGNOSIS — D61818 Other pancytopenia: Secondary | ICD-10-CM | POA: Diagnosis not present

## 2023-04-13 DIAGNOSIS — Z79899 Other long term (current) drug therapy: Secondary | ICD-10-CM | POA: Insufficient documentation

## 2023-04-13 DIAGNOSIS — F1290 Cannabis use, unspecified, uncomplicated: Secondary | ICD-10-CM | POA: Diagnosis not present

## 2023-04-13 DIAGNOSIS — I10 Essential (primary) hypertension: Secondary | ICD-10-CM | POA: Diagnosis not present

## 2023-04-13 DIAGNOSIS — K7030 Alcoholic cirrhosis of liver without ascites: Secondary | ICD-10-CM | POA: Diagnosis not present

## 2023-04-13 DIAGNOSIS — I200 Unstable angina: Principal | ICD-10-CM

## 2023-04-13 DIAGNOSIS — F1210 Cannabis abuse, uncomplicated: Secondary | ICD-10-CM

## 2023-04-13 DIAGNOSIS — I2510 Atherosclerotic heart disease of native coronary artery without angina pectoris: Secondary | ICD-10-CM

## 2023-04-13 DIAGNOSIS — R030 Elevated blood-pressure reading, without diagnosis of hypertension: Secondary | ICD-10-CM

## 2023-04-13 DIAGNOSIS — R000 Tachycardia, unspecified: Secondary | ICD-10-CM

## 2023-04-13 DIAGNOSIS — D7589 Other specified diseases of blood and blood-forming organs: Secondary | ICD-10-CM

## 2023-04-13 DIAGNOSIS — K700 Alcoholic fatty liver: Secondary | ICD-10-CM | POA: Diagnosis not present

## 2023-04-13 LAB — COMPREHENSIVE METABOLIC PANEL
ALT: 45 U/L — ABNORMAL HIGH (ref 0–44)
AST: 66 U/L — ABNORMAL HIGH (ref 15–41)
Albumin: 3.4 g/dL — ABNORMAL LOW (ref 3.5–5.0)
Alkaline Phosphatase: 58 U/L (ref 38–126)
Anion gap: 9 (ref 5–15)
BUN: 9 mg/dL (ref 6–20)
CO2: 26 mmol/L (ref 22–32)
Calcium: 8.9 mg/dL (ref 8.9–10.3)
Chloride: 103 mmol/L (ref 98–111)
Creatinine, Ser: 1.04 mg/dL (ref 0.61–1.24)
GFR, Estimated: >60 mL/min (ref >60)
Glucose, Bld: 112 mg/dL — ABNORMAL HIGH (ref 70–99)
Potassium: 3.3 mmol/L — ABNORMAL LOW (ref 3.5–5.1)
Sodium: 138 mmol/L (ref 135–145)
Total Bilirubin: 0.8 mg/dL (ref <1.2)
Total Protein: 6.5 g/dL (ref 6.5–8.1)

## 2023-04-13 LAB — CBC WITH DIFFERENTIAL/PLATELET
Abs Immature Granulocytes: 0.01 10*3/uL (ref 0.00–0.07)
Basophils Absolute: 0 10*3/uL (ref 0.0–0.1)
Basophils Relative: 1 %
Eosinophils Absolute: 0.1 10*3/uL (ref 0.0–0.5)
Eosinophils Relative: 3 %
HCT: 39.4 % (ref 39.0–52.0)
Hemoglobin: 13.1 g/dL (ref 13.0–17.0)
Immature Granulocytes: 0 %
Lymphocytes Relative: 45 %
Lymphs Abs: 1.5 10*3/uL (ref 0.7–4.0)
MCH: 33 pg (ref 26.0–34.0)
MCHC: 33.2 g/dL (ref 30.0–36.0)
MCV: 99.2 fL (ref 80.0–100.0)
Monocytes Absolute: 0.3 10*3/uL (ref 0.1–1.0)
Monocytes Relative: 8 %
Neutro Abs: 1.5 10*3/uL — ABNORMAL LOW (ref 1.7–7.7)
Neutrophils Relative %: 43 %
Platelets: 78 10*3/uL — ABNORMAL LOW (ref 150–400)
RBC: 3.97 MIL/uL — ABNORMAL LOW (ref 4.22–5.81)
RDW: 23.9 % — ABNORMAL HIGH (ref 11.5–15.5)
WBC: 3.4 10*3/uL — ABNORMAL LOW (ref 4.0–10.5)
nRBC: 0 % (ref 0.0–0.2)

## 2023-04-13 LAB — ETHANOL: Alcohol, Ethyl (B): 245 mg/dL — ABNORMAL HIGH (ref <10)

## 2023-04-13 LAB — TROPONIN I (HIGH SENSITIVITY): Troponin I (High Sensitivity): 6 ng/L (ref <18)

## 2023-04-13 MED ORDER — SODIUM CHLORIDE 0.9 % IV BOLUS
1000.0000 mL | Freq: Once | INTRAVENOUS | Status: AC
Start: 2023-04-13 — End: 2023-04-13
  Administered 2023-04-13: 1000 mL via INTRAVENOUS

## 2023-04-13 NOTE — ED Provider Notes (Incomplete)
[place] EMERGENCY DEPARTMENT AT [facility] Provider Note  CSN: [id number] Arrival date & time: 04/13/23 2126  Chief Complaint(s) Chest Pain  HPI Vincent Watkins is a 52 y.o. male history of diabetes, alcohol use disorder presenting to the emergency department with chest pain.  Patient reportedly drank significant amount of alcohol today, went to bed and then called out to his family member reporting chest pain.  Patient reports that he has had chest pain on and off for 3 weeks.  Reports that comes and goes.  Started today when he was in bed.  No shortness of breath.  No recent travel or surgeries.  He reports that he has been noncompliant with all of his home medications but cannot really tell me why.  No fevers or chills, cough.  No runny nose or sore throat.  No leg swelling.  Pain does not radiate.   Past Medical History Past Medical History:  Diagnosis Date  . Chronic kidney disease   . Hypertension   . Pneumonia   . Pre-diabetes   . Smoker    Patient Active Problem List   Diagnosis Date Noted  . Renal mass 03/31/2022  . Left rib fracture 03/19/2020  . Pleural effusion 03/19/2020  . Diabetes (HCC) 03/19/2020  . Pneumonia 03/18/2020  . Pleuritis 03/18/2020  . Paresthesia 02/05/2016  . BMI 28.0-28.9,adult 10/25/2015  . Smoker    Home Medication(s) Prior to Admission medications   Medication Sig Start Date End Date Taking? Authorizing Provider  cetirizine (ZYRTEC) 10 MG tablet Take 1 tablet (10 mg total) by mouth daily. Patient not taking: Reported on 03/18/2022 10/18/21   Carlisle Beers, FNP  docusate sodium (COLACE) 100 MG capsule Take 1 capsule (100 mg total) by mouth 2 (two) times daily. 03/31/22   Harrie Foreman, PA-C  doxycycline (VIBRAMYCIN) 100 MG capsule Take 1 capsule (100 mg total) by mouth 2 (two) times daily. 12/28/22   Alvira Monday, MD  DULoxetine (CYMBALTA) 60 MG capsule Take 60 mg by mouth daily. 01/29/22   [provider]  folic acid  (FOLVITE) 1 MG tablet Take 1 mg by mouth daily. Patient not taking: Reported on 03/18/2022 01/26/19   [provider]  HYDROcodone-acetaminophen (NORCO) 5-325 MG tablet Take 1-2 tablets by mouth every 6 (six) hours as needed for moderate pain or severe pain. 03/31/22   Harrie Foreman, PA-C  lisinopril (ZESTRIL) 20 MG tablet Take 20 mg by mouth daily.  01/26/19   [provider]                                                                                                                                    Past Surgical History Past Surgical History:  Procedure Laterality Date  . FOOT FRACTURE SURGERY Left   . IR THORACENTESIS ASP PLEURAL SPACE W/IMG GUIDE  03/19/2020  . ROBOTIC ASSITED PARTIAL NEPHRECTOMY Right 03/31/2022   Procedure: XI ROBOTIC ASSITED PARTIAL  NEPHRECTOMY;  Surgeon: Rene Paci, MD;  Location: [facility]S;  Service: Urology;  Laterality: Right;  4 HRS   Family History Family History  Problem Relation Age of Onset  . Hyperlipidemia Mother   . Other Father        fall  . Cancer Brother        renal cell    Social History Social History   Tobacco Use  . Smoking status: Every Day    Current packs/day: 0.50    Average packs/day: 0.5 packs/day for 26.0 years (13.0 ttl pk-yrs)    Types: Cigarettes  . Smokeless tobacco: Never  Vaping Use  . Vaping status: Never Used  Substance Use Topics  . Alcohol use: Yes    Alcohol/week: 56.0 standard drinks of alcohol    Types: 56 Standard drinks or equivalent per week    Comment: 8-9 beers per day. 6-7 shots  . Drug use: Yes    Frequency: 7.0 times per week    Types: Marijuana   Allergies Patient has no known allergies.  Review of Systems Review of Systems  All other systems reviewed and are negative.   Physical Exam Vital Signs  I have reviewed the triage vital signs BP 108/66   Pulse (!) 101   Temp 98 F (36.7 C) (Oral)   Resp 17   SpO2 99%  Physical Exam Vitals and nursing note  reviewed.  Constitutional:      General: He is not in acute distress.    Appearance: Normal appearance.     Comments: Smells of alcohol  HENT:     Mouth/Throat:     Mouth: Mucous membranes are moist.  Eyes:     Conjunctiva/sclera: Conjunctivae normal.  Cardiovascular:     Rate and Rhythm: Normal rate and regular rhythm.  Pulmonary:     Effort: Pulmonary effort is normal. No respiratory distress.     Breath sounds: Normal breath sounds.  Abdominal:     General: Abdomen is flat.     Palpations: Abdomen is soft.     Tenderness: There is no abdominal tenderness.  Musculoskeletal:     Right lower leg: No edema.     Left lower leg: No edema.  Skin:    General: Skin is warm and dry.     Capillary Refill: Capillary refill takes less than 2 seconds.  Neurological:     Mental Status: He is alert and oriented to person, place, and time. Mental status is at baseline.  Psychiatric:        Mood and Affect: Mood normal.        Behavior: Behavior normal.     ED Results and Treatments Labs (all labs ordered are listed, but only abnormal results are displayed) Labs Reviewed  COMPREHENSIVE METABOLIC PANEL - Abnormal; Notable for the following components:      Result Value   Potassium 3.3 (*)    Glucose, Bld 112 (*)    Albumin 3.4 (*)    AST 66 (*)    ALT 45 (*)    All other components within normal limits  CBC WITH DIFFERENTIAL/PLATELET - Abnormal; Notable for the following components:   WBC 3.4 (*)    RBC 3.97 (*)    RDW 23.9 (*)    Platelets 78 (*)    Neutro Abs 1.5 (*)    All other components within normal limits  ETHANOL - Abnormal; Notable for the following components:   Alcohol, Ethyl (B) 245 (*)  All other components within normal limits  D-DIMER, QUANTITATIVE  TROPONIN I (HIGH SENSITIVITY)  TROPONIN I (HIGH SENSITIVITY)                                                                                                                          Radiology DG Chest Port 1  View  Result Date: 04/13/2023 CLINICAL DATA:  Intermittent left chest pain EXAM: PORTABLE CHEST 1 VIEW COMPARISON:  08/24/2022 FINDINGS: The heart size and mediastinal contours are within normal limits. Both lungs are clear. The visualized skeletal structures are unremarkable. IMPRESSION: No active disease. Electronically Signed   By: Jasmine Pang M.D.   On: 04/13/2023 22:52    Pertinent labs & imaging results that were available during my care of the patient were reviewed by me and considered in my medical decision making (see MDM for details).  Medications Ordered in ED Medications  sodium chloride 0.9 % bolus 1,000 mL (0 mLs Intravenous Stopped 04/13/23 2351)                                                                                                                                     Procedures Procedures  (including critical care time)  Medical Decision Making / ED Course   MDM:  52 year old presenting to the emergency department with episode of chest pain.  Patient appears intoxicated, otherwise physical exam unremarkable.  Unclear cause of his symptoms.  Will check troponin to evaluate for cardiac cause.  Given onset prior to arrival, will need delta troponin.      Additional history obtained: -Additional history obtained from {wsadditionalhistorian:28072} -External records from outside source obtained and reviewed including: Chart review including previous notes, labs, imaging, consultation notes including ***   Lab Tests: -I ordered, reviewed, and interpreted labs.   The pertinent results include:   Labs Reviewed  COMPREHENSIVE METABOLIC PANEL - Abnormal; Notable for the following components:      Result Value   Potassium 3.3 (*)    Glucose, Bld 112 (*)    Albumin 3.4 (*)    AST 66 (*)    ALT 45 (*)    All other components within normal limits  CBC WITH DIFFERENTIAL/PLATELET - Abnormal; Notable for the following components:   WBC 3.4 (*)    RBC 3.97 (*)     RDW 23.9 (*)    Platelets 78 (*)    Neutro  Abs 1.5 (*)    All other components within normal limits  ETHANOL - Abnormal; Notable for the following components:   Alcohol, Ethyl (B) 245 (*)    All other components within normal limits  D-DIMER, QUANTITATIVE  TROPONIN I (HIGH SENSITIVITY)  TROPONIN I (HIGH SENSITIVITY)    Notable for ***  EKG   EKG Interpretation Date/Time:    Ventricular Rate:    PR Interval:    QRS Duration:    QT Interval:    QTC Calculation:   R Axis:      Text Interpretation:           Imaging Studies ordered: I ordered imaging studies including *** On my interpretation imaging demonstrates *** I independently visualized and interpreted imaging. I agree with the radiologist interpretation   Medicines ordered and prescription drug management: Meds ordered this encounter  Medications  . sodium chloride 0.9 % bolus 1,000 mL    -I have reviewed the patients home medicines and have made adjustments as needed   Consultations Obtained: I requested consultation with the ***,  and discussed lab and imaging findings as well as pertinent plan - they recommend: ***   Cardiac Monitoring: The patient was maintained on a cardiac monitor.  I personally viewed and interpreted the cardiac monitored which showed an underlying rhythm of: ***  Social Determinants of Health:  Diagnosis or treatment significantly limited by social determinants of health: {wssoc:28071}   Reevaluation: After the interventions noted above, I reevaluated the patient and found that their symptoms have {resolved/improved/worsened:23923::"improved"}  Co morbidities that complicate the patient evaluation . Past Medical History:  Diagnosis Date  . Chronic kidney disease   . Hypertension   . Pneumonia   . Pre-diabetes   . Smoker       Dispostion: Disposition decision including need for hospitalization was considered, and patient {wsdispo:28070::"discharged from emergency  department."}    Final Clinical Impression(s) / ED Diagnoses Final diagnoses:  None     This chart was dictated using voice recognition software.  Despite best efforts to proofread,  errors can occur which can change the documentation meaning.

## 2023-04-13 NOTE — ED Triage Notes (Signed)
Pt bib [facility] coming from home with c/o intermittent left-sided chest pain x3 week. Endorses alcohol use recently. C/O chronic neck,shoulder, spine pain. Pt voluntarily stopped taking all home medications three weeks ago. Pt reports cardiac hx and does see a cardiologist. Denies SOB, dizziness. Pt reports to EMS that his blood pressure often drops. GCS 15.   EMS vital signs:  Unremarkable 12lead 80 systolic blood pressure drop, 409/81 most recent bp  110 sinus tach  96-98% RA 176 cbg 20 RAC

## 2023-04-13 NOTE — ED Provider Notes (Signed)
[place] EMERGENCY DEPARTMENT AT [facility] Provider Note  CSN: [id number] Arrival date & time: 04/13/23 2126  Chief Complaint(s) Chest Pain  HPI Vincent Watkins is a 52 y.o. male history of diabetes, alcohol use disorder presenting to the emergency department with chest pain.  Patient reportedly drank significant amount of alcohol today, went to bed and then called out to his family member reporting chest pain.  Patient reports that he has had chest pain on and off for 3 weeks.  Reports that comes and goes.  Started today when he was in bed.  No shortness of breath.  No recent travel or surgeries.  He reports that he has been noncompliant with all of his home medications but cannot really tell me why.  No fevers or chills, cough.  No runny nose or sore throat.  No leg swelling.  Pain does not radiate.   Past Medical History Past Medical History:  Diagnosis Date   Chronic kidney disease    Hypertension    Pneumonia    Pre-diabetes    Smoker    Patient Active Problem List   Diagnosis Date Noted   Renal mass 03/31/2022   Left rib fracture 03/19/2020   Pleural effusion 03/19/2020   Diabetes (HCC) 03/19/2020   Pneumonia 03/18/2020   Pleuritis 03/18/2020   Paresthesia 02/05/2016   BMI 28.0-28.9,adult 10/25/2015   Smoker    Home Medication(s) Prior to Admission medications   Medication Sig Start Date End Date Taking? Authorizing Provider  cetirizine (ZYRTEC) 10 MG tablet Take 1 tablet (10 mg total) by mouth daily. Patient not taking: Reported on 03/18/2022 10/18/21   Carlisle Beers, FNP  docusate sodium (COLACE) 100 MG capsule Take 1 capsule (100 mg total) by mouth 2 (two) times daily. 03/31/22   Harrie Foreman, PA-C  doxycycline (VIBRAMYCIN) 100 MG capsule Take 1 capsule (100 mg total) by mouth 2 (two) times daily. 12/28/22   Alvira Monday, MD  DULoxetine (CYMBALTA) 60 MG capsule Take 60 mg by mouth daily. 01/29/22   [provider]  folic acid (FOLVITE) 1  MG tablet Take 1 mg by mouth daily. Patient not taking: Reported on 03/18/2022 01/26/19   [provider]  HYDROcodone-acetaminophen (NORCO) 5-325 MG tablet Take 1-2 tablets by mouth every 6 (six) hours as needed for moderate pain or severe pain. 03/31/22   Harrie Foreman, PA-C  lisinopril (ZESTRIL) 20 MG tablet Take 20 mg by mouth daily.  01/26/19   [provider]                                                                                                                                    Past Surgical History Past Surgical History:  Procedure Laterality Date   FOOT FRACTURE SURGERY Left    IR THORACENTESIS ASP PLEURAL SPACE W/IMG GUIDE  03/19/2020   ROBOTIC ASSITED PARTIAL NEPHRECTOMY Right 03/31/2022   Procedure: XI ROBOTIC ASSITED PARTIAL  NEPHRECTOMY;  Surgeon: Rene Paci, MD;  Location: [facility];  Service: Urology;  Laterality: Right;  4 HRS   Family History Family History  Problem Relation Age of Onset   Hyperlipidemia Mother    Other Father        fall   Cancer Brother        renal cell    Social History Social History   Tobacco Use   Smoking status: Every Day    Current packs/day: 0.50    Average packs/day: 0.5 packs/day for 26.0 years (13.0 ttl pk-yrs)    Types: Cigarettes   Smokeless tobacco: Never  Vaping Use   Vaping status: Never Used  Substance Use Topics   Alcohol use: Yes    Alcohol/week: 56.0 standard drinks of alcohol    Types: 56 Standard drinks or equivalent per week    Comment: 8-9 beers per day. 6-7 shots   Drug use: Yes    Frequency: 7.0 times per week    Types: Marijuana   Allergies Patient has no known allergies.  Review of Systems Review of Systems  All other systems reviewed and are negative.   Physical Exam Vital Signs  I have reviewed the triage vital signs BP 108/66   Pulse (!) 101   Temp 98 F (36.7 C) (Oral)   Resp 17   SpO2 99%  Physical Exam Vitals and nursing note reviewed.  Constitutional:       General: He is not in acute distress.    Appearance: Normal appearance.     Comments: Smells of alcohol  HENT:     Mouth/Throat:     Mouth: Mucous membranes are moist.  Eyes:     Conjunctiva/sclera: Conjunctivae normal.  Cardiovascular:     Rate and Rhythm: Normal rate and regular rhythm.  Pulmonary:     Effort: Pulmonary effort is normal. No respiratory distress.     Breath sounds: Normal breath sounds.  Abdominal:     General: Abdomen is flat.     Palpations: Abdomen is soft.     Tenderness: There is no abdominal tenderness.  Musculoskeletal:     Right lower leg: No edema.     Left lower leg: No edema.  Skin:    General: Skin is warm and dry.     Capillary Refill: Capillary refill takes less than 2 seconds.  Neurological:     Mental Status: He is alert and oriented to person, place, and time. Mental status is at baseline.  Psychiatric:        Mood and Affect: Mood normal.        Behavior: Behavior normal.     ED Results and Treatments Labs (all labs ordered are listed, but only abnormal results are displayed) Labs Reviewed  COMPREHENSIVE METABOLIC PANEL - Abnormal; Notable for the following components:      Result Value   Potassium 3.3 (*)    Glucose, Bld 112 (*)    Albumin 3.4 (*)    AST 66 (*)    ALT 45 (*)    All other components within normal limits  CBC WITH DIFFERENTIAL/PLATELET - Abnormal; Notable for the following components:   WBC 3.4 (*)    RBC 3.97 (*)    RDW 23.9 (*)    Platelets 78 (*)    Neutro Abs 1.5 (*)    All other components within normal limits  ETHANOL - Abnormal; Notable for the following components:   Alcohol, Ethyl (B) 245 (*)  All other components within normal limits  D-DIMER, QUANTITATIVE  TROPONIN I (HIGH SENSITIVITY)  TROPONIN I (HIGH SENSITIVITY)                                                                                                                          Radiology DG Chest Port 1 View  Result Date:  04/13/2023 CLINICAL DATA:  Intermittent left chest pain EXAM: PORTABLE CHEST 1 VIEW COMPARISON:  08/24/2022 FINDINGS: The heart size and mediastinal contours are within normal limits. Both lungs are clear. The visualized skeletal structures are unremarkable. IMPRESSION: No active disease. Electronically Signed   By: Jasmine Pang M.D.   On: 04/13/2023 22:52    Pertinent labs & imaging results that were available during my care of the patient were reviewed by me and considered in my medical decision making (see MDM for details).  Medications Ordered in ED Medications  sodium chloride 0.9 % bolus 1,000 mL (0 mLs Intravenous Stopped 04/13/23 2351)                                                                                                                                     Procedures Procedures  (including critical care time)  Medical Decision Making / ED Course   MDM:  52 year old presenting to the emergency department with episode of chest pain.  Patient appears intoxicated, otherwise physical exam unremarkable.  Unclear cause of his symptoms.  Will check troponin to evaluate for cardiac cause.  Given onset prior to arrival, will need delta troponin.  Will check chest x-ray to evaluate for pneumonia or pneumothorax.  Chest x-ray is clear for these.  Given mild tachycardia will check D-dimer to evaluate for pulmonary embolism.  Very low concern for process such as dissection given 3 weeks of symptoms.  Will reassess.  Clinical Course as of 04/14/23 0008  Wed Apr 14, 2023  0008 Signed out to Dr. Durwin Nora pending delta troponin, d-dimer [WS]    Clinical Course User Index [WS] Lonell Grandchild, MD     Additional history obtained: -Additional history obtained from family and ems -External records from outside source obtained and reviewed including: Chart review including previous notes, labs, imaging, consultation notes including prior er visits    Lab Tests: -I ordered,  reviewed, and interpreted labs.   The pertinent results include:   Labs Reviewed  COMPREHENSIVE METABOLIC PANEL - Abnormal; Notable for  the following components:      Result Value   Potassium 3.3 (*)    Glucose, Bld 112 (*)    Albumin 3.4 (*)    AST 66 (*)    ALT 45 (*)    All other components within normal limits  CBC WITH DIFFERENTIAL/PLATELET - Abnormal; Notable for the following components:   WBC 3.4 (*)    RBC 3.97 (*)    RDW 23.9 (*)    Platelets 78 (*)    Neutro Abs 1.5 (*)    All other components within normal limits  ETHANOL - Abnormal; Notable for the following components:   Alcohol, Ethyl (B) 245 (*)    All other components within normal limits  D-DIMER, QUANTITATIVE  TROPONIN I (HIGH SENSITIVITY)  TROPONIN I (HIGH SENSITIVITY)    Notable for mild hypokalemia, elevated alcohol. Leukopenia and thrombocytopenia likely due to alcohol use   EKG   EKG Interpretation Date/Time:  Tuesday April 13 2023 21:47:28 EST Ventricular Rate:  92 PR Interval:  130 QRS Duration:  104 QT Interval:  372 QTC Calculation: 461 R Axis:   70  Text Interpretation: Sinus rhythm No significant change since last tracing Confirmed by Alvino Blood ([id number]) on 04/13/2023 11:59:00 PM         Imaging Studies ordered: I ordered imaging studies including CXR On my interpretation imaging demonstrates no acute process I independently visualized and interpreted imaging. I agree with the radiologist interpretation   Medicines ordered and prescription drug management: Meds ordered this encounter  Medications   sodium chloride 0.9 % bolus 1,000 mL    -I have reviewed the patients home medicines and have made adjustments as needed   Cardiac Monitoring: The patient was maintained on a cardiac monitor.  I personally viewed and interpreted the cardiac monitored which showed an underlying rhythm of: sinus tachycardia   Social Determinants of Health:  Diagnosis or treatment  significantly limited by social determinants of health: alcohol use   Reevaluation: After the interventions noted above, I reevaluated the patient and found that their symptoms have improved  Co morbidities that complicate the patient evaluation  Past Medical History:  Diagnosis Date   Chronic kidney disease    Hypertension    Pneumonia    Pre-diabetes    Smoker       Dispostion: Disposition decision including need for hospitalization was considered, and patient disposition pending at time of sign out.    Final Clinical Impression(s) / ED Diagnoses Final diagnoses:  Chest pain, unspecified type     This chart was dictated using voice recognition software.  Despite best efforts to proofread,  errors can occur which can change the documentation meaning.    Lonell Grandchild, MD 04/14/23 [phone]

## 2023-04-14 ENCOUNTER — Encounter (HOSPITAL_COMMUNITY): Payer: Self-pay | Admitting: Internal Medicine

## 2023-04-14 ENCOUNTER — Emergency Department (HOSPITAL_COMMUNITY): Payer: BC Managed Care – PPO

## 2023-04-14 ENCOUNTER — Observation Stay (HOSPITAL_COMMUNITY): Payer: BC Managed Care – PPO

## 2023-04-14 ENCOUNTER — Other Ambulatory Visit: Payer: Self-pay

## 2023-04-14 DIAGNOSIS — F1210 Cannabis abuse, uncomplicated: Secondary | ICD-10-CM

## 2023-04-14 DIAGNOSIS — R079 Chest pain, unspecified: Secondary | ICD-10-CM | POA: Diagnosis not present

## 2023-04-14 DIAGNOSIS — R000 Tachycardia, unspecified: Secondary | ICD-10-CM

## 2023-04-14 DIAGNOSIS — F10920 Alcohol use, unspecified with intoxication, uncomplicated: Secondary | ICD-10-CM | POA: Diagnosis not present

## 2023-04-14 DIAGNOSIS — R072 Precordial pain: Secondary | ICD-10-CM

## 2023-04-14 DIAGNOSIS — D7589 Other specified diseases of blood and blood-forming organs: Secondary | ICD-10-CM

## 2023-04-14 DIAGNOSIS — R030 Elevated blood-pressure reading, without diagnosis of hypertension: Secondary | ICD-10-CM

## 2023-04-14 DIAGNOSIS — I200 Unstable angina: Secondary | ICD-10-CM | POA: Diagnosis present

## 2023-04-14 DIAGNOSIS — F17210 Nicotine dependence, cigarettes, uncomplicated: Secondary | ICD-10-CM

## 2023-04-14 LAB — HEMOGLOBIN A1C
Hgb A1c MFr Bld: 5.4 % (ref 4.8–5.6)
Mean Plasma Glucose: 108.28 mg/dL

## 2023-04-14 LAB — COMPREHENSIVE METABOLIC PANEL
ALT: 45 U/L — ABNORMAL HIGH (ref 0–44)
AST: 61 U/L — ABNORMAL HIGH (ref 15–41)
Albumin: 3.6 g/dL (ref 3.5–5.0)
Alkaline Phosphatase: 60 U/L (ref 38–126)
Anion gap: 9 (ref 5–15)
BUN: 9 mg/dL (ref 6–20)
CO2: 23 mmol/L (ref 22–32)
Calcium: 8.8 mg/dL — ABNORMAL LOW (ref 8.9–10.3)
Chloride: 105 mmol/L (ref 98–111)
Creatinine, Ser: 0.86 mg/dL (ref 0.61–1.24)
GFR, Estimated: >60 mL/min (ref >60)
Glucose, Bld: 120 mg/dL — ABNORMAL HIGH (ref 70–99)
Potassium: 4.5 mmol/L (ref 3.5–5.1)
Sodium: 137 mmol/L (ref 135–145)
Total Bilirubin: 0.8 mg/dL (ref <1.2)
Total Protein: 6.9 g/dL (ref 6.5–8.1)

## 2023-04-14 LAB — CBC WITH DIFFERENTIAL/PLATELET
Abs Immature Granulocytes: 0 10*3/uL (ref 0.00–0.07)
Basophils Absolute: 0 10*3/uL (ref 0.0–0.1)
Basophils Relative: 0 %
Eosinophils Absolute: 0.1 10*3/uL (ref 0.0–0.5)
Eosinophils Relative: 2 %
HCT: 40.7 % (ref 39.0–52.0)
Hemoglobin: 13.1 g/dL (ref 13.0–17.0)
Lymphocytes Relative: 31 %
Lymphs Abs: 0.9 10*3/uL (ref 0.7–4.0)
MCH: 32.1 pg (ref 26.0–34.0)
MCHC: 32.2 g/dL (ref 30.0–36.0)
MCV: 99.8 fL (ref 80.0–100.0)
Monocytes Absolute: 0.1 10*3/uL (ref 0.1–1.0)
Monocytes Relative: 4 %
Neutro Abs: 1.8 10*3/uL (ref 1.7–7.7)
Neutrophils Relative %: 63 %
Platelets: 65 10*3/uL — ABNORMAL LOW (ref 150–400)
RBC: 4.08 MIL/uL — ABNORMAL LOW (ref 4.22–5.81)
RDW: 23.9 % — ABNORMAL HIGH (ref 11.5–15.5)
WBC: 2.9 10*3/uL — ABNORMAL LOW (ref 4.0–10.5)
nRBC: 0 % (ref 0.0–0.2)
nRBC: 0 /100{WBCs}

## 2023-04-14 LAB — D-DIMER, QUANTITATIVE: D-Dimer, Quant: 0.65 ug{FEU}/mL — ABNORMAL HIGH (ref 0.00–0.50)

## 2023-04-14 LAB — ECHOCARDIOGRAM COMPLETE: S' Lateral: 2.5 cm

## 2023-04-14 LAB — LIPASE, BLOOD
Lipase: 83 U/L — ABNORMAL HIGH (ref 11–51)
Lipase: 76 U/L — ABNORMAL HIGH (ref 11–51)

## 2023-04-14 LAB — LIPID PANEL
Cholesterol: 153 mg/dL (ref 0–200)
HDL: 47 mg/dL (ref >40)
LDL Cholesterol: 78 mg/dL (ref 0–99)
Total CHOL/HDL Ratio: 3.3 {ratio}
Triglycerides: 139 mg/dL (ref <150)
VLDL: 28 mg/dL (ref 0–40)

## 2023-04-14 LAB — RAPID URINE DRUG SCREEN, HOSP PERFORMED
Amphetamines: NOT DETECTED
Barbiturates: NOT DETECTED
Benzodiazepines: POSITIVE — AB
Cocaine: NOT DETECTED
Opiates: NOT DETECTED
Tetrahydrocannabinol: POSITIVE — AB

## 2023-04-14 LAB — TROPONIN I (HIGH SENSITIVITY)
Troponin I (High Sensitivity): 7 ng/L (ref <18)
Troponin I (High Sensitivity): 6 ng/L (ref <18)
Troponin I (High Sensitivity): 6 ng/L (ref <18)

## 2023-04-14 LAB — VITAMIN B12: Vitamin B-12: 411 pg/mL (ref 180–914)

## 2023-04-14 LAB — TSH: TSH: 2.519 u[IU]/mL (ref 0.350–4.500)

## 2023-04-14 LAB — MAGNESIUM
Magnesium: 1.6 mg/dL — ABNORMAL LOW (ref 1.7–2.4)
Magnesium: 2 mg/dL (ref 1.7–2.4)

## 2023-04-14 LAB — HIV ANTIBODY (ROUTINE TESTING W REFLEX): HIV Screen 4th Generation wRfx: NONREACTIVE

## 2023-04-14 LAB — ETHANOL: Alcohol, Ethyl (B): 15 mg/dL — ABNORMAL HIGH (ref <10)

## 2023-04-14 MED ORDER — THIAMINE HCL 100 MG/ML IJ SOLN: 100.0000 mg | Freq: Every day | INTRAMUSCULAR | Status: DC

## 2023-04-14 MED ORDER — ADULT MULTIVITAMIN W/MINERALS CH
1.0000 | ORAL_TABLET | Freq: Every day | ORAL | Status: DC
  Administered 2023-04-14 – 2023-04-15 (×2): 1 via ORAL
  Filled 2023-04-14 (×2): qty 1

## 2023-04-14 MED ORDER — CHLORDIAZEPOXIDE HCL 5 MG PO CAPS
10.0000 mg | ORAL_CAPSULE | Freq: Three times a day (TID) | ORAL | Status: DC
  Administered 2023-04-14 – 2023-04-15 (×4): 10 mg via ORAL
  Filled 2023-04-14 (×4): qty 2

## 2023-04-14 MED ORDER — MAGNESIUM SULFATE 2 GM/50ML IV SOLN
2.0000 g | Freq: Once | INTRAVENOUS | Status: AC
  Administered 2023-04-14: 2 g via INTRAVENOUS
  Filled 2023-04-14: qty 50

## 2023-04-14 MED ORDER — DILTIAZEM HCL 60 MG PO TABS
60.0000 mg | ORAL_TABLET | Freq: Three times a day (TID) | ORAL | Status: DC
  Administered 2023-04-14 – 2023-04-15 (×2): 60 mg via ORAL
  Filled 2023-04-14 (×2): qty 1

## 2023-04-14 MED ORDER — ACETAMINOPHEN 650 MG RE SUPP: 650.0000 mg | Freq: Four times a day (QID) | RECTAL | Status: DC | PRN

## 2023-04-14 MED ORDER — THIAMINE MONONITRATE 100 MG PO TABS: 100.0000 mg | ORAL_TABLET | Freq: Every day | ORAL | Status: DC

## 2023-04-14 MED ORDER — NICOTINE 14 MG/24HR TD PT24
14.0000 mg | MEDICATED_PATCH | Freq: Every day | TRANSDERMAL | Status: DC
  Administered 2023-04-14 – 2023-04-15 (×2): 14 mg via TRANSDERMAL
  Filled 2023-04-14 (×2): qty 1

## 2023-04-14 MED ORDER — ROSUVASTATIN CALCIUM 20 MG PO TABS
20.0000 mg | ORAL_TABLET | Freq: Every day | ORAL | Status: DC
  Administered 2023-04-14 – 2023-04-15 (×2): 20 mg via ORAL
  Filled 2023-04-14 (×3): qty 1

## 2023-04-14 MED ORDER — METOPROLOL TARTRATE 5 MG/5ML IV SOLN: 5.0000 mg | INTRAVENOUS | Status: DC | PRN

## 2023-04-14 MED ORDER — SODIUM CHLORIDE 0.9% FLUSH
3.0000 mL | Freq: Two times a day (BID) | INTRAVENOUS | Status: DC
  Administered 2023-04-14 – 2023-04-15 (×3): 3 mL via INTRAVENOUS

## 2023-04-14 MED ORDER — LACTATED RINGERS IV BOLUS
1000.0000 mL | Freq: Once | INTRAVENOUS | Status: AC
  Administered 2023-04-14: 1000 mL via INTRAVENOUS

## 2023-04-14 MED ORDER — IOHEXOL 350 MG/ML SOLN
75.0000 mL | Freq: Once | INTRAVENOUS | Status: AC | PRN
  Administered 2023-04-14: 75 mL via INTRAVENOUS

## 2023-04-14 MED ORDER — THIAMINE MONONITRATE 100 MG PO TABS
100.0000 mg | ORAL_TABLET | Freq: Every day | ORAL | Status: DC
  Administered 2023-04-14 – 2023-04-15 (×2): 100 mg via ORAL
  Filled 2023-04-14 (×2): qty 1

## 2023-04-14 MED ORDER — NITROGLYCERIN 0.4 MG SL SUBL: 0.4000 mg | SUBLINGUAL_TABLET | SUBLINGUAL | Status: DC | PRN

## 2023-04-14 MED ORDER — TRAZODONE HCL 50 MG PO TABS: 50.0000 mg | ORAL_TABLET | Freq: Every evening | ORAL | Status: DC | PRN

## 2023-04-14 MED ORDER — IPRATROPIUM-ALBUTEROL 0.5-2.5 (3) MG/3ML IN SOLN: 3.0000 mL | RESPIRATORY_TRACT | Status: DC | PRN

## 2023-04-14 MED ORDER — ACETAMINOPHEN 325 MG PO TABS: 650.0000 mg | ORAL_TABLET | ORAL | Status: DC | PRN

## 2023-04-14 MED ORDER — ENOXAPARIN SODIUM 40 MG/0.4ML IJ SOSY: 40.0000 mg | PREFILLED_SYRINGE | Freq: Every day | INTRAMUSCULAR | Status: DC

## 2023-04-14 MED ORDER — LOSARTAN POTASSIUM 50 MG PO TABS
25.0000 mg | ORAL_TABLET | Freq: Every day | ORAL | Status: DC
  Administered 2023-04-14 – 2023-04-15 (×2): 25 mg via ORAL
  Filled 2023-04-14 (×2): qty 1

## 2023-04-14 MED ORDER — HYDRALAZINE HCL 20 MG/ML IJ SOLN: 10.0000 mg | INTRAMUSCULAR | Status: DC | PRN

## 2023-04-14 MED ORDER — OXYCODONE-ACETAMINOPHEN 7.5-325 MG PO TABS
1.0000 | ORAL_TABLET | Freq: Four times a day (QID) | ORAL | Status: DC | PRN
Start: 2023-04-14 — End: 2023-04-14

## 2023-04-14 MED ORDER — OXYCODONE-ACETAMINOPHEN 5-325 MG PO TABS
1.0000 | ORAL_TABLET | Freq: Four times a day (QID) | ORAL | Status: DC | PRN
  Administered 2023-04-14: 1 via ORAL
  Filled 2023-04-14: qty 1

## 2023-04-14 MED ORDER — ONDANSETRON HCL 4 MG/2ML IJ SOLN: 4.0000 mg | Freq: Four times a day (QID) | INTRAMUSCULAR | Status: DC | PRN

## 2023-04-14 MED ORDER — POTASSIUM CHLORIDE CRYS ER 20 MEQ PO TBCR
40.0000 meq | EXTENDED_RELEASE_TABLET | Freq: Once | ORAL | Status: AC
  Administered 2023-04-14: 40 meq via ORAL
  Filled 2023-04-14: qty 2

## 2023-04-14 MED ORDER — ONDANSETRON HCL 4 MG PO TABS: 4.0000 mg | ORAL_TABLET | Freq: Four times a day (QID) | ORAL | Status: DC | PRN

## 2023-04-14 MED ORDER — ACETAMINOPHEN 325 MG PO TABS: 650.0000 mg | ORAL_TABLET | Freq: Four times a day (QID) | ORAL | Status: DC | PRN

## 2023-04-14 MED ORDER — OXYCODONE HCL 5 MG PO TABS: 2.5000 mg | ORAL_TABLET | Freq: Four times a day (QID) | ORAL | Status: DC | PRN

## 2023-04-14 MED ORDER — LORAZEPAM 1 MG PO TABS
1.0000 mg | ORAL_TABLET | ORAL | Status: DC | PRN
  Administered 2023-04-14 (×2): 1 mg via ORAL
  Administered 2023-04-14: 2 mg via ORAL
  Administered 2023-04-15: 1 mg via ORAL
  Filled 2023-04-14 (×2): qty 1
  Filled 2023-04-14: qty 2
  Filled 2023-04-14: qty 1

## 2023-04-14 MED ORDER — FOLIC ACID 1 MG PO TABS
1.0000 mg | ORAL_TABLET | Freq: Every day | ORAL | Status: DC
  Administered 2023-04-14 – 2023-04-15 (×2): 1 mg via ORAL
  Filled 2023-04-14 (×2): qty 1

## 2023-04-14 MED ORDER — THIAMINE HCL 100 MG/ML IJ SOLN
100.0000 mg | Freq: Every day | INTRAMUSCULAR | Status: DC
  Filled 2023-04-14: qty 2

## 2023-04-14 MED ORDER — ASPIRIN 81 MG PO TBEC
81.0000 mg | DELAYED_RELEASE_TABLET | Freq: Every day | ORAL | Status: DC
  Administered 2023-04-14 – 2023-04-15 (×2): 81 mg via ORAL
  Filled 2023-04-14 (×2): qty 1

## 2023-04-14 MED ORDER — LORAZEPAM 2 MG/ML IJ SOLN
1.0000 mg | INTRAMUSCULAR | Status: DC | PRN
  Administered 2023-04-14: 1 mg via INTRAVENOUS
  Filled 2023-04-14: qty 1

## 2023-04-14 MED ORDER — GUAIFENESIN 100 MG/5ML PO LIQD: 5.0000 mL | ORAL | Status: DC | PRN

## 2023-04-14 MED ORDER — SENNOSIDES-DOCUSATE SODIUM 8.6-50 MG PO TABS: 1.0000 | ORAL_TABLET | Freq: Every evening | ORAL | Status: DC | PRN

## 2023-04-14 NOTE — ED Notes (Signed)
ED TO INPATIENT HANDOFF REPORT  ED Nurse Name and Phone #: Rodney Booze [phone]  S Name/Age/Gender Vincent Watkins 52 y.o. male Room/Bed: 042C/042C  Code Status   Code Status: Full Code  Home/SNF/Other Home Patient oriented to: self, place, time, and situation Is this baseline? Yes   Triage Complete: Triage complete  Chief Complaint Unstable angina (HCC) [I20.0] Chest pain [R07.9]  Triage Note Pt bib [facility] coming from home with c/o intermittent left-sided chest pain x3 week. Endorses alcohol use recently. C/O chronic neck,shoulder, spine pain. Pt voluntarily stopped taking all home medications three weeks ago. Pt reports cardiac hx and does see a cardiologist. Denies SOB, dizziness. Pt reports to EMS that his blood pressure often drops. GCS 15.   EMS vital signs:  Unremarkable 12lead 80 systolic blood pressure drop, 846/96 most recent bp  110 sinus tach  96-98% RA 176 cbg 20 RAC    Allergies No Known Allergies  Level of Care/Admitting Diagnosis ED Disposition     ED Disposition  Admit   Condition  --   Comment  Hospital Area: [facility] [[id number]]  Level of Care: Telemetry Medical [104]  May admit patient to [facility] or [facility] if equivalent level of care is available:: Yes  Covid Evaluation: Confirmed COVID Negative  Diagnosis: Chest pain [295284]  Admitting Physician: Miguel Rota [[id number]]  Attending Physician: Miguel Rota [[id number]]  Certification:: I certify this patient will need inpatient services for at least 2 midnights  Expected Medical Readiness: 04/16/2023          B Medical/Surgery History Past Medical History:  Diagnosis Date   Chronic kidney disease    Hypertension    Pneumonia    Pre-diabetes    Smoker    Past Surgical History:  Procedure Laterality Date   FOOT FRACTURE SURGERY Left    IR THORACENTESIS ASP PLEURAL SPACE W/IMG GUIDE  03/19/2020   ROBOTIC ASSITED PARTIAL NEPHRECTOMY Right 03/31/2022   Procedure:  XI ROBOTIC ASSITED PARTIAL NEPHRECTOMY;  Surgeon: Rene Paci, MD;  Location: [facility];  Service: Urology;  Laterality: Right;  4 HRS     A IV Location/Drains/Wounds Patient Lines/Drains/Airways Status     Active Line/Drains/Airways     Name Placement date Placement time Site Days   Peripheral IV 04/14/23 20 G Anterior;Right Forearm 04/14/23  --  Forearm  less than 1   Incision - 4 Ports Abdomen Right;Lateral;Lower Superior;Umbilicus Right;Lateral;Mid Right;Lateral;Upper 03/31/22  0810  -- 379            Intake/Output Last 24 hours  Intake/Output Summary (Last 24 hours) at 04/14/2023 1645 Last data filed at 04/14/2023 6440 Gross per 24 hour  Intake 2050 ml  Output --  Net 2050 ml    Labs/Imaging Results for orders placed or performed during the hospital encounter of 04/13/23 (from the past 48 hour(s))  Comprehensive metabolic panel     Status: Abnormal   Collection Time: 04/13/23 10:24 PM  Result Value Ref Range   Sodium 138 135 - 145 mmol/L   Potassium 3.3 (L) 3.5 - 5.1 mmol/L   Chloride 103 98 - 111 mmol/L   CO2 26 22 - 32 mmol/L   Glucose, Bld 112 (H) 70 - 99 mg/dL    Comment: Glucose reference range applies only to samples taken after fasting for at least 8 hours.   BUN 9 6 - 20 mg/dL   Creatinine, Ser 3.47 0.61 - 1.24 mg/dL   Calcium 8.9 8.9 - 42.5 mg/dL  Total Protein 6.5 6.5 - 8.1 g/dL   Albumin 3.4 (L) 3.5 - 5.0 g/dL   AST 66 (H) 15 - 41 U/L   ALT 45 (H) 0 - 44 U/L   Alkaline Phosphatase 58 38 - 126 U/L   Total Bilirubin 0.8 <1.2 mg/dL   GFR, Estimated >16 >10 mL/min    Comment: (NOTE) Calculated using the CKD-EPI Creatinine Equation (2021)    Anion gap 9 5 - 15    Comment: Performed at [facility], [address] [place], [place] [address]  CBC with Differential     Status: Abnormal   Collection Time: 04/13/23 10:24 PM  Result Value Ref Range   WBC 3.4 (L) 4.0 - 10.5 K/uL   RBC 3.97 (L) 4.22 - 5.81 MIL/uL   Hemoglobin 13.1 13.0 -  17.0 g/dL   HCT 40.9 81.1 - 91.4 %   MCV 99.2 80.0 - 100.0 fL   MCH 33.0 26.0 - 34.0 pg   MCHC 33.2 30.0 - 36.0 g/dL   RDW 78.2 (H) 95.6 - 21.3 %   Platelets 78 (L) 150 - 400 K/uL    Comment: Immature Platelet Fraction may be clinically indicated, consider ordering this additional test [id number] REPEATED TO VERIFY PLATELET COUNT CONFIRMED BY SMEAR    nRBC 0.0 0.0 - 0.2 %   Neutrophils Relative % 43 %   Neutro Abs 1.5 (L) 1.7 - 7.7 K/uL   Lymphocytes Relative 45 %   Lymphs Abs 1.5 0.7 - 4.0 K/uL   Monocytes Relative 8 %   Monocytes Absolute 0.3 0.1 - 1.0 K/uL   Eosinophils Relative 3 %   Eosinophils Absolute 0.1 0.0 - 0.5 K/uL   Basophils Relative 1 %   Basophils Absolute 0.0 0.0 - 0.1 K/uL   Immature Granulocytes 0 %   Abs Immature Granulocytes 0.01 0.00 - 0.07 K/uL   Ovalocytes PRESENT     Comment: Performed at [facility], [address], [place], [place] [address]  Ethanol     Status: Abnormal   Collection Time: 04/13/23 10:24 PM  Result Value Ref Range   Alcohol, Ethyl (B) 245 (H) <10 mg/dL    Comment: (NOTE) Lowest detectable limit for serum alcohol is 10 mg/dL.  For medical purposes only. Performed at [facility], [address], [place], [place] [address]   Troponin I (High Sensitivity)     Status: None   Collection Time: 04/13/23 10:24 PM  Result Value Ref Range   Troponin I (High Sensitivity) 6 <18 ng/L    Comment: (NOTE) Elevated high sensitivity troponin I (hsTnI) values and significant  changes across serial measurements may suggest ACS but many other  chronic and acute conditions are known to elevate hsTnI results.  Refer to the "Links" section for chest pain algorithms and additional  guidance. Performed at [facility], [address] [place], [place] [address]   D-dimer, quantitative     Status: Abnormal   Collection Time: 04/14/23 12:03 AM  Result Value Ref Range   D-Dimer, Quant 0.65 (H) 0.00 - 0.50 ug/mL-FEU    Comment:  (NOTE) At the manufacturer cut-off value of 0.5 g/mL FEU, this assay has a negative predictive value of 95-100%.This assay is intended for use in conjunction with a clinical pretest probability (PTP) assessment model to exclude pulmonary embolism (PE) and deep venous thrombosis (DVT) in outpatients suspected of PE or DVT. Results should be correlated with clinical presentation. Performed at [facility]  Lab, [address], [place], [place] [address]   Troponin I (High Sensitivity)     Status: None   Collection Time: 04/14/23 12:03 AM  Result Value Ref Range   Troponin I (High Sensitivity) 7 <18 ng/L    Comment: (NOTE) Elevated high sensitivity troponin I (hsTnI) values and significant  changes across serial measurements may suggest ACS but many other  chronic and acute conditions are known to elevate hsTnI results.  Refer to the "Links" section for chest pain algorithms and additional  guidance. Performed at [facility], [address] [place], [place] [address]   Magnesium     Status: Abnormal   Collection Time: 04/14/23 12:03 AM  Result Value Ref Range   Magnesium 1.6 (L) 1.7 - 2.4 mg/dL    Comment: Performed at [facility], [address], [place], [place] [address]  Lipase, blood     Status: Abnormal   Collection Time: 04/14/23 12:03 AM  Result Value Ref Range   Lipase 83 (H) 11 - 51 U/L    Comment: Performed at [facility], [address], [place], [place] [address]  Lipid panel     Status: None   Collection Time: 04/14/23  4:02 AM  Result Value Ref Range   Cholesterol 153 0 - 200 mg/dL   Triglycerides 284 <132 mg/dL   HDL 47 >44 mg/dL   Total CHOL/HDL Ratio 3.3 RATIO   VLDL 28 0 - 40 mg/dL   LDL Cholesterol 78 0 - 99 mg/dL    Comment:        Total Cholesterol/HDL:CHD Risk Coronary Heart Disease Risk Table                     Men   Women  1/2 Average Risk   3.4   3.3  Average Risk       5.0   4.4  2 X Average Risk   9.6   7.1  3 X  Average Risk  23.4   11.0        Use the calculated Patient Ratio above and the CHD Risk Table to determine the patient's CHD Risk.        ATP III CLASSIFICATION (LDL):  <100     mg/dL   Optimal  010-272  mg/dL   Near or Above                    Optimal  130-159  mg/dL   Borderline  536-644  mg/dL   High  >034     mg/dL   Very High Performed at [facility], [address], [place], [place] [address]   Hemoglobin A1c     Status: None   Collection Time: 04/14/23  4:02 AM  Result Value Ref Range   Hgb A1c MFr Bld 5.4 4.8 - 5.6 %    Comment: (NOTE) Pre diabetes:          5.7%-6.4%  Diabetes:              >6.4%  Glycemic control for   <7.0% adults with diabetes    Mean Plasma Glucose 108.28 mg/dL    Comment: Performed at [facility] Lab, [address], [place], [place] [address]  TSH     Status: None   Collection Time: 04/14/23  4:02 AM  Result Value Ref Range   TSH 2.519 0.350 - 4.500 uIU/mL    Comment: Performed  by a 3rd Generation assay with a functional sensitivity of <=0.01 uIU/mL. Performed at [facility], [address] [place], [place] [address]   Rapid urine drug screen (hospital performed)     Status: Abnormal   Collection Time: 04/14/23  6:50 AM  Result Value Ref Range   Opiates NONE DETECTED NONE DETECTED   Cocaine NONE DETECTED NONE DETECTED   Benzodiazepines POSITIVE (A) NONE DETECTED   Amphetamines NONE DETECTED NONE DETECTED   Tetrahydrocannabinol POSITIVE (A) NONE DETECTED   Barbiturates NONE DETECTED NONE DETECTED    Comment: (NOTE) DRUG SCREEN FOR MEDICAL PURPOSES ONLY.  IF CONFIRMATION IS NEEDED FOR ANY PURPOSE, NOTIFY LAB WITHIN 5 DAYS.  LOWEST DETECTABLE LIMITS FOR URINE DRUG SCREEN Drug Class                     Cutoff (ng/mL) Amphetamine and metabolites    1000 Barbiturate and metabolites    200 Benzodiazepine                 200 Opiates and metabolites        300 Cocaine and metabolites        300 THC                             50 Performed at [facility], [address] [place], [place] [address]   Comprehensive metabolic panel     Status: Abnormal   Collection Time: 04/14/23  9:27 AM  Result Value Ref Range   Sodium 137 135 - 145 mmol/L   Potassium 4.5 3.5 - 5.1 mmol/L   Chloride 105 98 - 111 mmol/L   CO2 23 22 - 32 mmol/L   Glucose, Bld 120 (H) 70 - 99 mg/dL    Comment: Glucose reference range applies only to samples taken after fasting for at least 8 hours.   BUN 9 6 - 20 mg/dL   Creatinine, Ser 8.46 0.61 - 1.24 mg/dL   Calcium 8.8 (L) 8.9 - 10.3 mg/dL   Total Protein 6.9 6.5 - 8.1 g/dL   Albumin 3.6 3.5 - 5.0 g/dL   AST 61 (H) 15 - 41 U/L   ALT 45 (H) 0 - 44 U/L   Alkaline Phosphatase 60 38 - 126 U/L   Total Bilirubin 0.8 <1.2 mg/dL   GFR, Estimated >96 >29 mL/min    Comment: (NOTE) Calculated using the CKD-EPI Creatinine Equation (2021)    Anion gap 9 5 - 15    Comment: Performed at [facility], [address], [place], [place] [address]  CBC with Differential/Platelet     Status: Abnormal   Collection Time: 04/14/23  9:27 AM  Result Value Ref Range   WBC 2.9 (L) 4.0 - 10.5 K/uL   RBC 4.08 (L) 4.22 - 5.81 MIL/uL   Hemoglobin 13.1 13.0 - 17.0 g/dL   HCT 32.4 40.1 - 02.7 %   MCV 99.8 80.0 - 100.0 fL   MCH 32.1 26.0 - 34.0 pg   MCHC 32.2 30.0 - 36.0 g/dL   RDW 25.3 (H) 66.4 - 40.3 %   Platelets 65 (L) 150 - 400 K/uL    Comment: Immature Platelet Fraction may be clinically indicated, consider ordering this additional test [id number] REPEATED TO VERIFY    nRBC 0.0 0.0 - 0.2 %   Neutrophils Relative % 63 %   Neutro Abs 1.8 1.7 - 7.7 K/uL  Lymphocytes Relative 31 %   Lymphs Abs 0.9 0.7 - 4.0 K/uL   Monocytes Relative 4 %   Monocytes Absolute 0.1 0.1 - 1.0 K/uL   Eosinophils Relative 2 %   Eosinophils Absolute 0.1 0.0 - 0.5 K/uL   Basophils Relative 0 %   Basophils Absolute 0.0 0.0 - 0.1 K/uL   nRBC 0 0 /100 WBC   Abs Immature Granulocytes 0.00 0.00 - 0.07 K/uL    Polychromasia PRESENT     Comment: Performed at [facility], [address], [place], [place] [address]  Lipase, blood     Status: Abnormal   Collection Time: 04/14/23  9:27 AM  Result Value Ref Range   Lipase 76 (H) 11 - 51 U/L    Comment: Performed at [facility], [address] [place], [place] [address]  Ethanol     Status: Abnormal   Collection Time: 04/14/23  9:27 AM  Result Value Ref Range   Alcohol, Ethyl (B) 15 (H) <10 mg/dL    Comment: (NOTE) Lowest detectable limit for serum alcohol is 10 mg/dL.  For medical purposes only. Performed at [facility], [address], [place], [place] [address]   Magnesium     Status: None   Collection Time: 04/14/23  9:27 AM  Result Value Ref Range   Magnesium 2.0 1.7 - 2.4 mg/dL    Comment: Performed at [facility], [address] [place], [place] [address]  Troponin I (High Sensitivity)     Status: None   Collection Time: 04/14/23  9:27 AM  Result Value Ref Range   Troponin I (High Sensitivity) 6 <18 ng/L    Comment: (NOTE) Elevated high sensitivity troponin I (hsTnI) values and significant  changes across serial measurements may suggest ACS but many other  chronic and acute conditions are known to elevate hsTnI results.  Refer to the "Links" section for chest pain algorithms and additional  guidance. Performed at [facility], [address], [place], [place] [address]   HIV Antibody (routine testing w rflx)     Status: None   Collection Time: 04/14/23 12:04 PM  Result Value Ref Range   HIV Screen 4th Generation wRfx Non Reactive Non Reactive    Comment: Performed at [facility], [address], [place], [place] [address]  Vitamin B12     Status: None   Collection Time: 04/14/23 12:04 PM  Result Value Ref Range   Vitamin B-12 411 180 - 914 pg/mL    Comment: (NOTE) This assay is not validated for testing neonatal or myeloproliferative syndrome specimens for Vitamin B12  levels. Performed at [facility], [address], [place], [place] [address]    ECHOCARDIOGRAM COMPLETE  Result Date: 04/14/2023    ECHOCARDIOGRAM REPORT   Patient Name:   Equan MIYAHIRA Date of Exam: 04/14/2023 Medical Rec #:  [id number]   Height:       69.0 in Accession #:    [id number]  Weight:       193.0 lb Date of Birth:  04/25/1971   BSA:          2.035 m Patient Age:    52 years    BP:           160/101 mmHg Patient Gender: M           HR:           100 bpm. Exam Location:  Inpatient  Procedure: 2D Echo, Color Doppler and Cardiac Doppler Indications:    chest pain  History:        Patient has no prior history of Echocardiogram examinations.                 Risk Factors:Hypertension and Current Smoker.  Sonographer:    [name] RDCS Referring Phys: [phone] DEBBY CROSLEY  Sonographer Comments: Global longitudinal strain was attempted. IMPRESSIONS  1. Left ventricular ejection fraction, by estimation, is 65 to 70%. The left ventricle has normal function. The left ventricle has no regional wall motion abnormalities. Left ventricular diastolic parameters are indeterminate.  2. Right ventricular systolic function is normal. The right ventricular size is normal. Tricuspid regurgitation signal is inadequate for assessing PA pressure.  3. The mitral valve is normal in structure. No evidence of mitral valve regurgitation. No evidence of mitral stenosis.  4. The aortic valve is normal in structure. Aortic valve regurgitation is not visualized. No aortic stenosis is present.  5. The inferior vena cava is normal in size with greater than 50% respiratory variability, suggesting right atrial pressure of 3 mmHg. FINDINGS  Left Ventricle: Left ventricular ejection fraction, by estimation, is 65 to 70%. The left ventricle has normal function. The left ventricle has no regional wall motion abnormalities. Global longitudinal strain performed but not reported based on interpreter judgement due to suboptimal  tracking. The left ventricular internal cavity size was small. There is no left ventricular hypertrophy. Left ventricular diastolic parameters are indeterminate. Right Ventricle: The right ventricular size is normal. No increase in right ventricular wall thickness. Right ventricular systolic function is normal. Tricuspid regurgitation signal is inadequate for assessing PA pressure. Left Atrium: Left atrial size was normal in size. Right Atrium: Right atrial size was normal in size. Pericardium: There is no evidence of pericardial effusion. Mitral Valve: The mitral valve is normal in structure. No evidence of mitral valve regurgitation. No evidence of mitral valve stenosis. Tricuspid Valve: The tricuspid valve is normal in structure. Tricuspid valve regurgitation is not demonstrated. No evidence of tricuspid stenosis. Aortic Valve: The aortic valve is normal in structure. Aortic valve regurgitation is not visualized. No aortic stenosis is present. Pulmonic Valve: The pulmonic valve was normal in structure. Pulmonic valve regurgitation is not visualized. No evidence of pulmonic stenosis. Aorta: The aortic root is normal in size and structure. Venous: The inferior vena cava is normal in size with greater than 50% respiratory variability, suggesting right atrial pressure of 3 mmHg. IAS/Shunts: No atrial level shunt detected by color flow Doppler.  LEFT VENTRICLE PLAX 2D LVIDd:         4.50 cm   Diastology LVIDs:         2.50 cm   LV e' medial: 5.11 cm/s LV PW:         0.90 cm LV IVS:        1.10 cm LVOT diam:     1.90 cm LV SV:         58 LV SV Index:   29 LVOT Area:     2.84 cm  RIGHT VENTRICLE RV Basal diam:  2.90 cm RV S prime:     20.30 cm/s TAPSE (M-mode): 2.6 cm LEFT ATRIUM             Index        RIGHT ATRIUM           Index LA diam:        4.00 cm 1.97 cm/m  RA Area:     13.90 cm LA Vol (A2C):   51.1 ml 25.11 ml/m  RA Volume:   32.30 ml  15.87 ml/m LA Vol (A4C):   43.4 ml 21.33 ml/m LA Biplane Vol: 51.7  ml 25.41 ml/m  AORTIC VALVE LVOT Vmax:   124.00 cm/s LVOT Vmean:  84.500 cm/s LVOT VTI:    0.205 m  AORTA Ao Root diam: 3.10 cm Ao Asc diam:  3.30 cm  SHUNTS Systemic VTI:  0.20 m Systemic Diam: 1.90 cm Kardie Tobb DO Electronically signed by Thomasene Ripple DO Signature Date/Time: 04/14/2023/2:57:00 PM    Final    CT Angio Chest PE W and/or Wo Contrast  Result Date: 04/14/2023 CLINICAL DATA:  Intermittent left-sided chest pain x3 weeks. EXAM: CT ANGIOGRAPHY CHEST WITH CONTRAST TECHNIQUE: Multidetector CT imaging of the chest was performed using the standard protocol during bolus administration of intravenous contrast. Multiplanar CT image reconstructions and MIPs were obtained to evaluate the vascular anatomy. RADIATION DOSE REDUCTION: This exam was performed according to the departmental dose-optimization program which includes automated exposure control, adjustment of the mA and/or kV according to patient size and/or use of iterative reconstruction technique. CONTRAST:  75mL OMNIPAQUE IOHEXOL 350 MG/ML SOLN COMPARISON:  January 22, 2022 FINDINGS: Cardiovascular: The thoracic aorta is normal in appearance. Satisfactory opacification of the pulmonary arteries to the segmental level. No evidence of pulmonary embolism. Normal heart size with moderate to marked severity coronary artery calcification. No pericardial effusion. Mediastinum/Nodes: No enlarged mediastinal, hilar, or axillary lymph nodes. Thyroid gland, trachea, and esophagus demonstrate no significant findings. Lungs/Pleura: Lungs are clear. No pleural effusion or pneumothorax. Upper Abdomen: There is diffuse fatty infiltration of the liver parenchyma. Numerous subcentimeter gallstones are seen within the lumen of an otherwise normal-appearing gallbladder. Musculoskeletal: A chronic anterior third left rib fracture is seen. Acute anterolateral seventh left rib fracture is also noted. Mild adjacent focal pleural thickening is seen. A displaced lateral  ninth left rib fracture is present. Review of the MIP images confirms the above findings. IMPRESSION: 1. No evidence of pulmonary embolism or other acute intrathoracic process. 2. Acute anterolateral seventh left rib and lateral ninth left rib fractures. 3. Chronic anterior third left rib fracture. 4. Cholelithiasis. 5. Hepatic steatosis. Electronically Signed   By: Aram Candela M.D.   On: 04/14/2023 02:20   DG Chest Port 1 View  Result Date: 04/13/2023 CLINICAL DATA:  Intermittent left chest pain EXAM: PORTABLE CHEST 1 VIEW COMPARISON:  08/24/2022 FINDINGS: The heart size and mediastinal contours are within normal limits. Both lungs are clear. The visualized skeletal structures are unremarkable. IMPRESSION: No active disease. Electronically Signed   By: Jasmine Pang M.D.   On: 04/13/2023 22:52    Pending Labs Unresulted Labs (From admission, onward)     Start     Ordered   04/15/23 0500  Comprehensive metabolic panel  Tomorrow morning,   R        04/14/23 1043   04/15/23 0500  CBC  Tomorrow morning,   R        04/14/23 1043   04/15/23 0500  Protime-INR  Tomorrow morning,   R        04/14/23 1043   04/15/23 0500  APTT  Tomorrow morning,   R        04/14/23 1043   04/15/23 0500  Magnesium  Tomorrow morning,   R        04/14/23 1106   04/15/23 0500  Basic metabolic panel  Daily,   R      04/14/23 1321   04/15/23 0500  CBC  Daily,   R      04/14/23 1321   04/15/23 0500  Magnesium  Daily,   R      04/14/23 1321   04/14/23 1300  Lipoprotein A (LPA)  Once,   STAT        04/14/23 1300   04/14/23 1300  Vitamin B6  Once,   STAT        04/14/23 1300            Vitals/Pain Today's Vitals   04/14/23 1330 04/14/23 1337 04/14/23 1440 04/14/23 1448  BP: (!) 151/100 (!) 151/100 (!) 167/103   Pulse: 96 (!) 102 100   Resp: 15  20   Temp:      TempSrc:      SpO2: 95%  98%   PainSc:    Asleep    Isolation Precautions No active isolations  Medications Medications  LORazepam  (ATIVAN) tablet 1-4 mg (1 mg Oral Given 04/14/23 1202)    Or  LORazepam (ATIVAN) injection 1-4 mg ( Intravenous See Alternative 04/14/23 1202)  folic acid (FOLVITE) tablet 1 mg (1 mg Oral Given 04/14/23 0925)  multivitamin with minerals tablet 1 tablet (1 tablet Oral Given 04/14/23 0925)  nitroGLYCERIN (NITROSTAT) SL tablet 0.4 mg (has no administration in time range)  thiamine (VITAMIN B1) tablet 100 mg (100 mg Oral Given 04/14/23 0418)    Or  thiamine (VITAMIN B1) injection 100 mg ( Intravenous See Alternative 04/14/23 0418)  aspirin EC tablet 81 mg (81 mg Oral Given 04/14/23 0418)  nicotine (NICODERM CQ - dosed in mg/24 hours) patch 14 mg (14 mg Transdermal Patch Applied 04/14/23 0417)  oxyCODONE-acetaminophen (PERCOCET/ROXICET) 5-325 MG per tablet 1 tablet (has no administration in time range)    And  oxyCODONE (Oxy IR/ROXICODONE) immediate release tablet 2.5 mg (has no administration in time range)  chlordiazePOXIDE (LIBRIUM) capsule 10 mg (10 mg Oral Given 04/14/23 0925)  sodium chloride flush (NS) 0.9 % injection 3 mL (3 mLs Intravenous Given 04/14/23 1100)  acetaminophen (TYLENOL) tablet 650 mg (has no administration in time range)    Or  acetaminophen (TYLENOL) suppository 650 mg (has no administration in time range)  ondansetron (ZOFRAN) tablet 4 mg (has no administration in time range)    Or  ondansetron (ZOFRAN) injection 4 mg (has no administration in time range)  senna-docusate (Senokot-S) tablet 1 tablet (has no administration in time range)  guaiFENesin (ROBITUSSIN) 100 MG/5ML liquid 5 mL (has no administration in time range)  traZODone (DESYREL) tablet 50 mg (has no administration in time range)  ipratropium-albuterol (DUONEB) 0.5-2.5 (3) MG/3ML nebulizer solution 3 mL (has no administration in time range)  metoprolol tartrate (LOPRESSOR) injection 5 mg (has no administration in time range)  hydrALAZINE (APRESOLINE) injection 10 mg (has no administration in time range)  sodium  chloride 0.9 % bolus 1,000 mL (0 mLs Intravenous Stopped 04/13/23 2351)  lactated ringers bolus 1,000 mL (0 mLs Intravenous Stopped 04/14/23 0258)  iohexol (OMNIPAQUE) 350 MG/ML injection 75 mL (75 mLs Intravenous Contrast Given 04/14/23 0210)  magnesium sulfate IVPB 2 g 50 mL (0 g Intravenous Stopped 04/14/23 0629)  potassium chloride SA (KLOR-CON M) CR tablet 40 mEq (40 mEq Oral Given 04/14/23 0418)    Mobility walks     Focused Assessments Cardiac Assessment Handoff:  Cardiac Rhythm: Normal sinus rhythm, Sinus tachycardia No results found for: "CKTOTAL", "CKMB", "CKMBINDEX", "TROPONINI" Lab  Results  Component Value Date   DDIMER 0.65 (H) 04/14/2023   Does the Patient currently have chest pain? No    R Recommendations: See Admitting Provider Note  Report given to:   Additional Notes:

## 2023-04-14 NOTE — ED Notes (Signed)
Pharmacy responded and stated that his 1600 medication will be ready in about a hr.

## 2023-04-14 NOTE — ED Notes (Signed)
When giving pt his medication I noticed that when I scanned his armband, PT is visibly shaking in his hands and when he raised his head to drink his water a shake was noticed as well.PT is not shaking when at rest.

## 2023-04-14 NOTE — ED Notes (Signed)
Called 6E to report that their room 15 is on the way up

## 2023-04-14 NOTE — ED Notes (Signed)
Patient transported to ECHO.

## 2023-04-14 NOTE — H&P (Addendum)
History and Physical    Patient: Vincent Watkins YOV:[id number] DOB: 01-21-1971 DOA: 04/13/2023 DOS: the patient was seen and examined on 04/14/2023 PCP: Kaleen Mask, MD  Patient coming from: Home Medical readiness/disposition: Anticipate will be ready for discharge on 12/5 and will discharge back home  Chief Complaint:  Chief Complaint  Patient presents with   Chest Pain   HPI: Vincent Watkins is a 52 y.o. male with medical history significant of ongoing tobacco abuse, heavy alcohol use, fatty liver and thrombocytopenia and likely underlying cirrhosis, hypertension, prediabetes.  Patient presented to the ED via EMS complaining of intermittent left-sided chest pain for 3 weeks.  Admits to regular alcohol use also complains of chronic neck shoulder and spine pain.  Patient voluntarily stopped taking all home medications 3 weeks prior apparently he has a cardiologist and has some type of cardiac history.  In the ED EKG demonstrates Q waves.  Enzymes are negative.  Patient was found to have elevated D-dimer and a CTA of the chest was completed that did not reveal a PE but did show moderate to severe coronary artery calcifications.  Additional evaluation revealed mild nonobstructive transaminitis, fatty liver, platelets 68,000, alcohol level 245, lipase 83.  X-ray revealed acute seventh and ninth rib fractures on the left.  Patient admits to a fall about 3 weeks prior.  Hospitalist service has been consulted to evaluate the patient for admission.  In further discussion with the patient he has 2 types of chest discomfort.  One is more sharp and lateral and consistent with pain associated with the rib fractures.  He describes a different substernal chest pain that is pressure-like is associated with shortness of breath and increasing fatigue and diaphoresis.  Reports the symptoms improve with rest.  He denies rest pain.  He states that he has difficulty feeling his feet and that may have contributed to  his fall although excessive intoxication could have contributed as well.  He confirms that he drinks 1/5 of liquor per day.  He admits to occasional THC use.  Review of Systems: As mentioned in the history of present illness. All other systems reviewed and are negative. Past Medical History:  Diagnosis Date   Chronic kidney disease    Hypertension    Pneumonia    Pre-diabetes    Smoker    Past Surgical History:  Procedure Laterality Date   FOOT FRACTURE SURGERY Left    IR THORACENTESIS ASP PLEURAL SPACE W/IMG GUIDE  03/19/2020   ROBOTIC ASSITED PARTIAL NEPHRECTOMY Right 03/31/2022   Procedure: XI ROBOTIC ASSITED PARTIAL NEPHRECTOMY;  Surgeon: Rene Paci, MD;  Location: [facility];  Service: Urology;  Laterality: Right;  4 HRS   Social History:  reports that he has been smoking cigarettes. He has a 13 pack-year smoking history. He has never used smokeless tobacco. He reports current alcohol use of about 56.0 standard drinks of alcohol per week. He reports current drug use. Frequency: 7.00 times per week. Drug: Marijuana.  No Known Allergies  Family History  Problem Relation Age of Onset   Hyperlipidemia Mother    Other Father        fall   Cancer Brother        renal cell    Prior to Admission medications   Medication Sig Start Date End Date Taking? Authorizing Provider  albuterol (VENTOLIN HFA) 108 (90 Base) MCG/ACT inhaler INHALE 1-2 PUFFS BY MOUTH EVERY 4-6 HOURS AS NEEDED FOR SHORTNESS OF BREATH   Yes [provider]  DULoxetine (CYMBALTA) 60 MG capsule Take 60 mg by mouth daily. 01/29/22  Yes [provider]  ferrous sulfate 325 (65 FE) MG EC tablet Take 325 mg by mouth daily.   Yes [provider]  lisinopril (ZESTRIL) 20 MG tablet Take 20 mg by mouth daily.  01/26/19  Yes [provider]  meloxicam (MOBIC) 15 MG tablet SMARTSIG:1 Tablet(s) By Mouth Every 24 Hours PRN   Yes [provider]  sildenafil (VIAGRA) 100 MG  tablet SMARTSIG:0.5-1 Tablet(s) By Mouth PRN   Yes [provider]  tadalafil (CIALIS) 5 MG tablet Take 5 mg by mouth daily.   Yes [provider]  tamsulosin (FLOMAX) 0.4 MG CAPS capsule TAKE 1 CAPSULE BY MOUTH 30 MINUTES AFTER SAME MEAL EVERY 24 HOURS FOR PROSTATE...MAY INCREASE TO 2 CAPSULES AFTER 2 WEEKS   Yes [provider]  Vitamin D, Ergocalciferol, 50000 units CAPS 1 capsule Orally once a week for 90 days 11/05/22  Yes [provider]    Physical Exam: Vitals:   04/14/23 0615 04/14/23 0629 04/14/23 0645 04/14/23 0746  BP: 120/69 120/69 (!) 147/94 127/82  Pulse: 99 99 90 (!) 110  Resp: 17  14 15   Temp:    98.2 F (36.8 C)  TempSrc:    Oral  SpO2: 96%  94% 98%   Constitutional: NAD, calm, comfortable-appears overall flushed appearance Respiratory: clear to auscultation bilaterally, no wheezing, no crackles. Normal respiratory effort. No accessory muscle use.  Cardiovascular: Regular rate and rhythm, no murmurs / rubs / gallops. No extremity edema. 2+ pedal pulses. No carotid bruits.  Abdomen: no tenderness, no masses palpated. No hepatosplenomegaly. Bowel sounds positive.  Musculoskeletal: no clubbing / cyanosis. No joint deformity upper and lower extremities. Good ROM, no contractures. Normal muscle tone.  Skin: no rashes, lesions, ulcers. No induration Neurologic: CN 2-12 grossly intact. Sensation intact although is diminished in the lower extremities from the mid shins down to the toes with patient reporting less sensation in the feet and toes, DTR normal. Strength 5/5 x all 4 extremities.  Psychiatric: Alert and oriented x 3. Normal mood.    Data Reviewed:  As per HPI  Assessment and Plan: Mixed chest pain Some features are more typical with known rib fractures while other symptoms are concerning for possible ischemic etiology although could be secondary to deconditioning or GI sources Appreciate cardiology assistance Patient has been  instructed to notify the nurse if he has another episode of the chest pressure so we could administer nitroglycerin and evaluate response Enzymes and EKG are negative despite patient having coronary calcifications.  Repeat troponin additional workup at discretion of cardiology team Echocardiogram pending Patient has thrombocytopenia therefore is not a candidate for empiric anticoagulation noting he is not having symptoms consistent with acute infarct or ischemia Lipid panel unremarkable  Hepatic steatosis/cirrhosis/pancytopenia Suspect alcoholic etiology Mild transaminitis without an obstructive total bilirubin Platelets 68,000 with white count 2900 Check coags Likely not a candidate for statin  Alcoholism/polysubstance abuse Patient admits to daily alcohol intake of 1/5 of liquor and occasional use of marijuana Initial blood alcohol level 245 with repeat 15 Lipase 83 but patient not experiencing abdominal pain or other GI symptoms.  Will repeat lipase in a.m.  Suspect secondary to alcohol use and pancreatic stress CIWA Ativan protocol along with scheduled Librium Drug screen positive for benzodiazepines and THC Seizure precautions Patient has peripheral neuropathy and likely secondary to B6/B12 deficiency-will check levels-currently on folate and thiamine per CIWA protocol.  No evidence  of Warnicke's encephalopathy but could benefit from higher dose thiamine  Fall with rib fractures Likely secondary to persistent intoxication as well as peripheral neuropathy Percocet for pain PT consultation  Prediabetes Hemoglobin A1c in November 2023 was 5.9 Obtain hemoglobin A1c this admission Alcoholism likely contributing-no apparent prior pancreatitis  Hypomagnesemia Likely secondary to ongoing alcohol use Magnesium has been replaced Repeat lab in a.m.  Tobacco abuse Counseled regarding cessation  Advance Care Planning:   Code Status: Full Code   VTE prophylaxis: SCDs  Consults:  Cardiology  Family Communication: Patient only  Severity of Illness: The appropriate patient status for this patient is OBSERVATION. Observation status is judged to be reasonable and necessary in order to provide the required intensity of service to ensure the patient's safety. The patient's presenting symptoms, physical exam findings, and initial radiographic and laboratory data in the context of their medical condition is felt to place them at decreased risk for further clinical deterioration. Furthermore, it is anticipated that the patient will be medically stable for discharge from the hospital within 2 midnights of admission.   Author: Junious Silk, NP 04/14/2023 7:56 AM  For on call review [email or web address].

## 2023-04-14 NOTE — ED Notes (Signed)
The pharmacy was notified about PT medication that is due at 1600 has not been dispensed.
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
## 2023-04-14 NOTE — ED Notes (Signed)
Transport given a ticket to ride to transport PT .

## 2023-04-14 NOTE — Progress Notes (Signed)
  Echocardiogram 2D Echocardiogram has been performed.  [name] 04/14/2023, 2:33 PM

## 2023-04-14 NOTE — ED Provider Notes (Addendum)
Care of patient assumed from Dr. Suezanne Jacquet.  This patient presented for chest pain.  Reports that has been intermittent over the past 3 weeks.  He is intoxicated.  Currently awaiting delta troponin and D-dimer. Physical Exam  BP 108/66   Pulse (!) 101   Temp 98 F (36.7 C) (Oral)   Resp 17   SpO2 99%   Physical Exam Vitals and nursing note reviewed.  Constitutional:      General: He is not in acute distress.    Appearance: He is well-developed. He is not ill-appearing, toxic-appearing or diaphoretic.  HENT:     Head: Normocephalic and atraumatic.  Eyes:     Conjunctiva/sclera: Conjunctivae normal.  Cardiovascular:     Rate and Rhythm: Normal rate and regular rhythm.     Heart sounds: No murmur heard. Pulmonary:     Effort: Pulmonary effort is normal. No respiratory distress.     Breath sounds: Normal breath sounds.  Chest:     Chest wall: No tenderness.  Abdominal:     Palpations: Abdomen is soft.     Tenderness: There is no abdominal tenderness.  Musculoskeletal:        General: No swelling. Normal range of motion.     Cervical back: Normal range of motion and neck supple.     Right lower leg: No edema.     Left lower leg: No edema.  Skin:    General: Skin is warm and dry.     Coloration: Skin is not cyanotic or pale.  Neurological:     General: No focal deficit present.     Mental Status: He is alert and oriented to person, place, and time.  Psychiatric:        Mood and Affect: Mood normal.        Behavior: Behavior normal.     Procedures  Procedures  ED Course / MDM   Clinical Course as of 04/14/23 0026  Wed Apr 14, 2023  0008 Signed out to Dr. Durwin Nora pending delta troponin, d-dimer [WS]    Clinical Course User Index [WS] Lonell Grandchild, MD   Medical Decision Making Amount and/or Complexity of Data Reviewed Labs: ordered. Radiology: ordered.  Risk OTC drugs. Prescription drug management. Decision regarding hospitalization.   On assessment,  patient is sleeping and snoring.  He is easily awakened.  When awakened, he does not seem intoxicated.  His blood pressures are soft in the range of 90s over 60s.  He states that he is on lisinopril.  He went several weeks without taking it but did take a dose tonight.  He denies any current symptoms, including chest pain.  When asked about his chest pain, patient describes a central chest pressure and pain that has been worsened with exertion and improved with rest over the past 3 weeks.  He had chest pain at rest earlier today which prompted his daughter to call 911.  I spoke with cardiologist on-call, Dr. Brayton Layman, who recommends a medicine admission.  Patient will likely undergo stress test or coronary CT tomorrow.  His D-dimer was mildly elevated which did prompt CTA of chest today.  Magnesium was found to be low.  Magnesium sulfate was ordered for replacement.  CTA of chest did not show evidence of PE.  It did show moderate to severe coronary calcifications which further raises concern for ACS.  CIWA protocol was ordered.  Patient was admitted for further management.       Gloris Manchester, MD 04/14/23 [phone]  Gloris Manchester, MD 04/14/23 [phone]
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
## 2023-04-15 DIAGNOSIS — F17210 Nicotine dependence, cigarettes, uncomplicated: Secondary | ICD-10-CM | POA: Diagnosis not present

## 2023-04-15 DIAGNOSIS — I2510 Atherosclerotic heart disease of native coronary artery without angina pectoris: Secondary | ICD-10-CM

## 2023-04-15 DIAGNOSIS — I10 Essential (primary) hypertension: Secondary | ICD-10-CM

## 2023-04-15 DIAGNOSIS — R000 Tachycardia, unspecified: Secondary | ICD-10-CM | POA: Diagnosis not present

## 2023-04-15 DIAGNOSIS — R072 Precordial pain: Secondary | ICD-10-CM | POA: Diagnosis not present

## 2023-04-15 DIAGNOSIS — I200 Unstable angina: Secondary | ICD-10-CM | POA: Diagnosis not present

## 2023-04-15 LAB — CBC
HCT: 37.8 % — ABNORMAL LOW (ref 39.0–52.0)
Hemoglobin: 12.2 g/dL — ABNORMAL LOW (ref 13.0–17.0)
MCH: 31.4 pg (ref 26.0–34.0)
MCHC: 32.3 g/dL (ref 30.0–36.0)
MCV: 97.2 fL (ref 80.0–100.0)
Platelets: 58 10*3/uL — ABNORMAL LOW (ref 150–400)
RBC: 3.89 MIL/uL — ABNORMAL LOW (ref 4.22–5.81)
RDW: 22.9 % — ABNORMAL HIGH (ref 11.5–15.5)
WBC: 2.9 10*3/uL — ABNORMAL LOW (ref 4.0–10.5)
nRBC: 0 % (ref 0.0–0.2)

## 2023-04-15 LAB — COMPREHENSIVE METABOLIC PANEL
ALT: 37 U/L (ref 0–44)
AST: 44 U/L — ABNORMAL HIGH (ref 15–41)
Albumin: 3.2 g/dL — ABNORMAL LOW (ref 3.5–5.0)
Alkaline Phosphatase: 58 U/L (ref 38–126)
Anion gap: 9 (ref 5–15)
BUN: 10 mg/dL (ref 6–20)
CO2: 23 mmol/L (ref 22–32)
Calcium: 8.7 mg/dL — ABNORMAL LOW (ref 8.9–10.3)
Chloride: 104 mmol/L (ref 98–111)
Creatinine, Ser: 0.89 mg/dL (ref 0.61–1.24)
GFR, Estimated: >60 mL/min (ref >60)
Glucose, Bld: 108 mg/dL — ABNORMAL HIGH (ref 70–99)
Potassium: 3.6 mmol/L (ref 3.5–5.1)
Sodium: 136 mmol/L (ref 135–145)
Total Bilirubin: 1.2 mg/dL — ABNORMAL HIGH (ref <1.2)
Total Protein: 6.1 g/dL — ABNORMAL LOW (ref 6.5–8.1)

## 2023-04-15 LAB — APTT: aPTT: 31 s (ref 24–36)

## 2023-04-15 LAB — MAGNESIUM: Magnesium: 2 mg/dL (ref 1.7–2.4)

## 2023-04-15 LAB — PROTIME-INR
INR: 1.1 (ref 0.8–1.2)
Prothrombin Time: 14.5 s (ref 11.4–15.2)

## 2023-04-15 LAB — LIPOPROTEIN A (LPA): Lipoprotein (a): 9.9 nmol/L (ref <75.0)

## 2023-04-15 MED ORDER — DILTIAZEM HCL 60 MG PO TABS: 90.0000 mg | ORAL_TABLET | Freq: Three times a day (TID) | ORAL | Status: DC

## 2023-04-15 MED ORDER — LOSARTAN POTASSIUM 50 MG PO TABS: 50.0000 mg | ORAL_TABLET | Freq: Every day | ORAL | Status: DC

## 2023-04-15 NOTE — Progress Notes (Signed)
Patient stated he wanted to leave AMA. Education provided regarding hospital stay and medication adjustments. Dr. Nelson Chimes and Dr. Odis Hollingshead notified via secure chat. PIV/tele removed. Patient signed AMA form and left the department.

## 2023-04-15 NOTE — Progress Notes (Signed)
PROGRESS NOTE    Vincent Watkins  VOZ:[id number] DOB: 08-12-1970 DOA: 04/13/2023 PCP: Vincent Mask, MD    Brief Narrative:   8 53-year-old with history of tobacco use, alcohol use, fatty liver, thrombocytopenia, cirrhosis, HTN, prediabetes comes to the hospital with complaints of left-sided chest pain ongoing for 3 weeks.  Upon admission D-dimer was elevated, CTA chest did not show any PE but showed severe coronary calcification.  X-ray also showed acute 7-9 rib fracture on the left side. Upon admission also noted to have alcohol intoxicated therefore plan to be placed on alcohol withdrawal protocol.  Cardiology consulted due to chest pain  Assessment & Plan:  Principal Problem:   Unstable angina (HCC) Active Problems:   Chest pain   Elevated BP without diagnosis of hypertension   Tachycardia   Alcoholic intoxication without complication (HCC)   Bicytopenia   Marijuana abuse   Continuous dependence on cigarette smoking    Atypical chest pain -This could be combination of possible ACS/demand ischemia versus rib fractures.  Troponins are negative, EKG does not show any acute ST-T changes.  CTA does show severe coronary artery calcification.  Echocardiogram shows preserved EF without wall motion abnormalities.  For now medical management including aspirin, statin, Cardizem.Patient has declined any further ischemic evaluation -LDL 78, lipoprotein 9.9, A1c 5.4.   Left-sided rib fracture - Pain control, lidocaine patch  Essential hypertension - Cardizem, losartan. Will increase Cardizem today IV as needed.   Alcohol abuse - Initial alcohol level greater than 245, slowly improving.  On alcohol withdrawal protocol  DVT prophylaxis: SCDs Start: 04/14/23 1042 Code Status: Full Family Communication:   Status is: Inpatient Hopefully we can discharge him in next 24 hours    Subjective: Doing well no complaints at this time.  Denies any chest pain.   Examination:  General  exam: Appears calm and comfortable  Respiratory system: Clear to auscultation. Respiratory effort normal. Cardiovascular system: S1 & S2 heard, RRR. No JVD, murmurs, rubs, gallops or clicks. No pedal edema. Gastrointestinal system: Abdomen is nondistended, soft and nontender. No organomegaly or masses felt. Normal bowel sounds heard. Central nervous system: Alert and oriented. No focal neurological deficits. Extremities: Symmetric 5 x 5 power. Skin: No rashes, lesions or ulcers Psychiatry: Judgement and insight appear normal. Mood & affect appropriate.                Diet Orders (From admission, onward)     Start     Ordered   04/15/23 1146  Diet Heart Room service appropriate? Yes; Fluid consistency: Thin  Diet effective now       Question Answer Comment  Room service appropriate? Yes   Fluid consistency: Thin      04/15/23 1146            Objective: Vitals:   04/15/23 0631 04/15/23 0700 04/15/23 1051 04/15/23 1211  BP: (!) 156/83 (!) 156/95 132/85 (!) 148/93  Pulse:  89 97 90  Resp:  16  18  Temp:  98 F (36.7 C)  97.6 F (36.4 C)  TempSrc:  Oral  Oral  SpO2:  95%  98%  Weight:      Height:        Intake/Output Summary (Last 24 hours) at 04/15/2023 1243 Last data filed at 04/14/2023 1931 Gross per 24 hour  Intake 462 ml  Output 275 ml  Net 187 ml   Filed Weights   04/14/23 1649 04/14/23 1755 04/15/23 0459  Weight: 87.5 kg 86.8 kg 86.5  kg    Scheduled Meds:  aspirin EC  81 mg Oral Daily   chlordiazePOXIDE  10 mg Oral TID   diltiazem  90 mg Oral Q8H   folic acid  1 mg Oral Daily   losartan  25 mg Oral Daily   multivitamin with minerals  1 tablet Oral Daily   nicotine  14 mg Transdermal Daily   rosuvastatin  20 mg Oral Daily   sodium chloride flush  3 mL Intravenous Q12H   thiamine  100 mg Oral Daily   Or   thiamine  100 mg Intravenous Daily   Continuous Infusions:  Nutritional status     Body mass index is 28.16 kg/m.  Data Reviewed:    CBC: Recent Labs  Lab 04/13/23 2224 04/14/23 0927 04/15/23 0355  WBC 3.4* 2.9* 2.9*  NEUTROABS 1.5* 1.8  --   HGB 13.1 13.1 12.2*  HCT 39.4 40.7 37.8*  MCV 99.2 99.8 97.2  PLT 78* 65* 58*   Basic Metabolic Panel: Recent Labs  Lab 04/13/23 2224 04/14/23 0003 04/14/23 0927 04/15/23 0355  NA 138  --  137 136  K 3.3*  --  4.5 3.6  CL 103  --  105 104  CO2 26  --  23 23  GLUCOSE 112*  --  120* 108*  BUN 9  --  9 10  CREATININE 1.04  --  0.86 0.89  CALCIUM 8.9  --  8.8* 8.7*  MG  --  1.6* 2.0 2.0   GFR: Estimated Creatinine Clearance: 104.5 mL/min (by C-G formula based on SCr of 0.89 mg/dL). Liver Function Tests: Recent Labs  Lab 04/13/23 2224 04/14/23 0927 04/15/23 0355  AST 66* 61* 44*  ALT 45* 45* 37  ALKPHOS 58 60 58  BILITOT 0.8 0.8 1.2*  PROT 6.5 6.9 6.1*  ALBUMIN 3.4* 3.6 3.2*   Recent Labs  Lab 04/14/23 0003 04/14/23 0927  LIPASE 83* 76*   No results for input(s): "AMMONIA" in the last 168 hours. Coagulation Profile: Recent Labs  Lab 04/15/23 0355  INR 1.1   Cardiac Enzymes: No results for input(s): "CKTOTAL", "CKMB", "CKMBINDEX", "TROPONINI" in the last 168 hours. BNP (last 3 results) No results for input(s): "PROBNP" in the last 8760 hours. HbA1C: Recent Labs    04/14/23 0402  HGBA1C 5.4   CBG: No results for input(s): "GLUCAP" in the last 168 hours. Lipid Profile: Recent Labs    04/14/23 0402  CHOL 153  HDL 47  LDLCALC 78  TRIG 139  CHOLHDL 3.3   Thyroid Function Tests: Recent Labs    04/14/23 0402  TSH 2.519   Anemia Panel: Recent Labs    04/14/23 1204  VITAMINB12 411   Sepsis Labs: No results for input(s): "PROCALCITON", "LATICACIDVEN" in the last 168 hours.  No results found for this or any previous visit (from the past 240 hour(s)).       Radiology Studies: ECHOCARDIOGRAM COMPLETE  Result Date: 04/14/2023    ECHOCARDIOGRAM REPORT   Patient Name:   Vincent Watkins Date of Exam: 04/14/2023 Medical Rec #:   [id number]   Height:       69.0 in Accession #:    [id number]  Weight:       193.0 lb Date of Birth:  [birth year]   BSA:          2.035 m Patient Age:    52 years    BP:           160/101 mmHg Patient Gender:  M           HR:           100 bpm. Exam Location:  Inpatient Procedure: 2D Echo, Color Doppler and Cardiac Doppler Indications:    chest pain  History:        Patient has no prior history of Echocardiogram examinations.                 Risk Factors:Hypertension and Current Smoker.  Sonographer:    [name] RDCS Referring Phys: [phone] DEBBY CROSLEY  Sonographer Comments: Global longitudinal strain was attempted. IMPRESSIONS  1. Left ventricular ejection fraction, by estimation, is 65 to 70%. The left ventricle has normal function. The left ventricle has no regional wall motion abnormalities. Left ventricular diastolic parameters are indeterminate.  2. Right ventricular systolic function is normal. The right ventricular size is normal. Tricuspid regurgitation signal is inadequate for assessing PA pressure.  3. The mitral valve is normal in structure. No evidence of mitral valve regurgitation. No evidence of mitral stenosis.  4. The aortic valve is normal in structure. Aortic valve regurgitation is not visualized. No aortic stenosis is present.  5. The inferior vena cava is normal in size with greater than 50% respiratory variability, suggesting right atrial pressure of 3 mmHg. FINDINGS  Left Ventricle: Left ventricular ejection fraction, by estimation, is 65 to 70%. The left ventricle has normal function. The left ventricle has no regional wall motion abnormalities. Global longitudinal strain performed but not reported based on interpreter judgement due to suboptimal tracking. The left ventricular internal cavity size was small. There is no left ventricular hypertrophy. Left ventricular diastolic parameters are indeterminate. Right Ventricle: The right ventricular size is normal. No increase in right  ventricular wall thickness. Right ventricular systolic function is normal. Tricuspid regurgitation signal is inadequate for assessing PA pressure. Left Atrium: Left atrial size was normal in size. Right Atrium: Right atrial size was normal in size. Pericardium: There is no evidence of pericardial effusion. Mitral Valve: The mitral valve is normal in structure. No evidence of mitral valve regurgitation. No evidence of mitral valve stenosis. Tricuspid Valve: The tricuspid valve is normal in structure. Tricuspid valve regurgitation is not demonstrated. No evidence of tricuspid stenosis. Aortic Valve: The aortic valve is normal in structure. Aortic valve regurgitation is not visualized. No aortic stenosis is present. Pulmonic Valve: The pulmonic valve was normal in structure. Pulmonic valve regurgitation is not visualized. No evidence of pulmonic stenosis. Aorta: The aortic root is normal in size and structure. Venous: The inferior vena cava is normal in size with greater than 50% respiratory variability, suggesting right atrial pressure of 3 mmHg. IAS/Shunts: No atrial level shunt detected by color flow Doppler.  LEFT VENTRICLE PLAX 2D LVIDd:         4.50 cm   Diastology LVIDs:         2.50 cm   LV e' medial: 5.11 cm/s LV PW:         0.90 cm LV IVS:        1.10 cm LVOT diam:     1.90 cm LV SV:         58 LV SV Index:   29 LVOT Area:     2.84 cm  RIGHT VENTRICLE RV Basal diam:  2.90 cm RV S prime:     20.30 cm/s TAPSE (M-mode): 2.6 cm LEFT ATRIUM             Index  RIGHT ATRIUM           Index LA diam:        4.00 cm 1.97 cm/m   RA Area:     13.90 cm LA Vol (A2C):   51.1 ml 25.11 ml/m  RA Volume:   32.30 ml  15.87 ml/m LA Vol (A4C):   43.4 ml 21.33 ml/m LA Biplane Vol: 51.7 ml 25.41 ml/m  AORTIC VALVE LVOT Vmax:   124.00 cm/s LVOT Vmean:  84.500 cm/s LVOT VTI:    0.205 m  AORTA Ao Root diam: 3.10 cm Ao Asc diam:  3.30 cm  SHUNTS Systemic VTI:  0.20 m Systemic Diam: 1.90 cm Kardie Tobb DO Electronically  signed by Thomasene Ripple DO Signature Date/Time: 04/14/2023/2:57:00 PM    Final    CT Angio Chest PE W and/or Wo Contrast  Result Date: 04/14/2023 CLINICAL DATA:  Intermittent left-sided chest pain x3 weeks. EXAM: CT ANGIOGRAPHY CHEST WITH CONTRAST TECHNIQUE: Multidetector CT imaging of the chest was performed using the standard protocol during bolus administration of intravenous contrast. Multiplanar CT image reconstructions and MIPs were obtained to evaluate the vascular anatomy. RADIATION DOSE REDUCTION: This exam was performed according to the departmental dose-optimization program which includes automated exposure control, adjustment of the mA and/or kV according to patient size and/or use of iterative reconstruction technique. CONTRAST:  75mL OMNIPAQUE IOHEXOL 350 MG/ML SOLN COMPARISON:  January 22, 2022 FINDINGS: Cardiovascular: The thoracic aorta is normal in appearance. Satisfactory opacification of the pulmonary arteries to the segmental level. No evidence of pulmonary embolism. Normal heart size with moderate to marked severity coronary artery calcification. No pericardial effusion. Mediastinum/Nodes: No enlarged mediastinal, hilar, or axillary lymph nodes. Thyroid gland, trachea, and esophagus demonstrate no significant findings. Lungs/Pleura: Lungs are clear. No pleural effusion or pneumothorax. Upper Abdomen: There is diffuse fatty infiltration of the liver parenchyma. Numerous subcentimeter gallstones are seen within the lumen of an otherwise normal-appearing gallbladder. Musculoskeletal: A chronic anterior third left rib fracture is seen. Acute anterolateral seventh left rib fracture is also noted. Mild adjacent focal pleural thickening is seen. A displaced lateral ninth left rib fracture is present. Review of the MIP images confirms the above findings. IMPRESSION: 1. No evidence of pulmonary embolism or other acute intrathoracic process. 2. Acute anterolateral seventh left rib and lateral ninth  left rib fractures. 3. Chronic anterior third left rib fracture. 4. Cholelithiasis. 5. Hepatic steatosis. Electronically Signed   By: Aram Candela M.D.   On: 04/14/2023 02:20   DG Chest Port 1 View  Result Date: 04/13/2023 CLINICAL DATA:  Intermittent left chest pain EXAM: PORTABLE CHEST 1 VIEW COMPARISON:  08/24/2022 FINDINGS: The heart size and mediastinal contours are within normal limits. Both lungs are clear. The visualized skeletal structures are unremarkable. IMPRESSION: No active disease. Electronically Signed   By: Jasmine Pang M.D.   On: 04/13/2023 22:52           LOS: 1 day   Time spent= 35 mins    Miguel Rota, MD [facility]  If 7PM-7AM, please contact night-coverage  04/15/2023, 12:43 PM

## 2023-04-15 NOTE — Hospital Course (Addendum)
Brief Narrative:   [age]-year-old with history of tobacco use, alcohol use, fatty liver, thrombocytopenia, cirrhosis, HTN, prediabetes comes to the hospital with complaints of left-sided chest pain ongoing for 3 weeks.  Upon admission D-dimer was elevated, CTA chest did not show any PE but showed severe coronary calcification.  X-ray also showed acute 7-9 rib fracture on the left side. Upon admission also noted to have alcohol intoxicated therefore plan to be placed on alcohol withdrawal protocol.  Cardiology consulted due to chest pain  Assessment & Plan:  Principal Problem:   Unstable angina (HCC) Active Problems:   Chest pain   Elevated BP without diagnosis of hypertension   Tachycardia   Alcoholic intoxication without complication (HCC)   Bicytopenia   Marijuana abuse   Continuous dependence on cigarette smoking    Atypical chest pain -This could be combination of possible ACS/demand ischemia versus rib fractures.  Troponins are negative, EKG does not show any acute ST-T changes.  CTA does show severe coronary artery calcification.  Echocardiogram shows preserved EF without wall motion abnormalities.  For now medical management including aspirin, statin, Cardizem.Patient has declined any further ischemic evaluation -LDL 78, lipoprotein 9.9, A1c 5.4.   Left-sided rib fracture - Pain control, lidocaine patch  Essential hypertension - Cardizem, losartan. Will increase Cardizem today IV as needed.   Alcohol abuse - Initial alcohol level greater than 245, slowly improving.  On alcohol withdrawal protocol  DVT prophylaxis: SCDs Start: 04/14/23 1042 Code Status: Full Family Communication:   Status is: Inpatient Hopefully we can discharge him in next 24 hours    Subjective: Doing well no complaints at this time.  Denies any chest pain.   Examination:  General exam: Appears calm and comfortable  Respiratory system: Clear to auscultation. Respiratory effort  normal. Cardiovascular system: S1 & S2 heard, RRR. No JVD, murmurs, rubs, gallops or clicks. No pedal edema. Gastrointestinal system: Abdomen is nondistended, soft and nontender. No organomegaly or masses felt. Normal bowel sounds heard. Central nervous system: Alert and oriented. No focal neurological deficits. Extremities: Symmetric 5 x 5 power. Skin: No rashes, lesions or ulcers Psychiatry: Judgement and insight appear normal. Mood & affect appropriate.
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
## 2023-04-17 LAB — VITAMIN B6: Vitamin B6: 4.1 ug/L (ref 3.4–65.2)

## 2023-04-26 ENCOUNTER — Ambulatory Visit: Payer: BC Managed Care – PPO | Admitting: Cardiovascular Disease

## 2023-05-04 ENCOUNTER — Other Ambulatory Visit: Payer: Self-pay

## 2023-06-02 ENCOUNTER — Telehealth: Payer: Self-pay

## 2023-06-02 NOTE — Telephone Encounter (Signed)
Called patient to reschedule appointment due to weather and booked with Dr Antoine Poche as new when he is an existing patient with Dr Odis Hollingshead.  He states that he wasn't going to be able to attend that or any appointments at this time. States his girlfriend is in a diabetic coma in the hospital and he feels doesn't want any appointments right now. Strongly encouraged to call office back to reschedule.

## 2023-06-02 NOTE — Progress Notes (Deleted)
 Cardiology Office Note   Date:  06/02/2023   ID:  Vincent Watkins, DOB Sep 24, 1970, MRN [id number]  PCP:  Kaleen Mask, MD  Cardiologist:   None Referring:  ***  No chief complaint on file.     History of Present Illness: Vincent Watkins is a 52 y.o. male who presents for ***     Past Medical History:  Diagnosis Date   Chronic kidney disease    Hypertension    Pneumonia    Pre-diabetes    Smoker     Past Surgical History:  Procedure Laterality Date   FOOT FRACTURE SURGERY Left    IR THORACENTESIS ASP PLEURAL SPACE W/IMG GUIDE  03/19/2020   ROBOTIC ASSITED PARTIAL NEPHRECTOMY Right 03/31/2022   Procedure: XI ROBOTIC ASSITED PARTIAL NEPHRECTOMY;  Surgeon: Rene Paci, MD;  Location: [facility];  Service: Urology;  Laterality: Right;  4 HRS     Current Outpatient Medications  Medication Sig Dispense Refill   albuterol (VENTOLIN HFA) 108 (90 Base) MCG/ACT inhaler INHALE 1-2 PUFFS BY MOUTH EVERY 4-6 HOURS AS NEEDED FOR SHORTNESS OF BREATH     DULoxetine (CYMBALTA) 60 MG capsule Take 60 mg by mouth daily.     ferrous sulfate 325 (65 FE) MG EC tablet Take 325 mg by mouth daily.     lisinopril (ZESTRIL) 20 MG tablet Take 20 mg by mouth daily.      meloxicam (MOBIC) 15 MG tablet SMARTSIG:1 Tablet(s) By Mouth Every 24 Hours PRN     sildenafil (VIAGRA) 100 MG tablet SMARTSIG:0.5-1 Tablet(s) By Mouth PRN     tadalafil (CIALIS) 5 MG tablet Take 5 mg by mouth daily.     tamsulosin (FLOMAX) 0.4 MG CAPS capsule TAKE 1 CAPSULE BY MOUTH 30 MINUTES AFTER SAME MEAL EVERY 24 HOURS FOR PROSTATE...MAY INCREASE TO 2 CAPSULES AFTER 2 WEEKS     Vitamin D, Ergocalciferol, 50000 units CAPS 1 capsule Orally once a week for 90 days     No current facility-administered medications for this visit.    Allergies:   Patient has no known allergies.    Social History:  The patient  reports that he has been smoking cigarettes. He has a 13 pack-year smoking history. He has never used  smokeless tobacco. He reports current alcohol use of about 56.0 standard drinks of alcohol per week. He reports current drug use. Frequency: 7.00 times per week. Drug: Marijuana.   Family History:  The patient's ***family history includes Cancer in his brother; Hyperlipidemia in his mother; Other in his father.    ROS:  Please see the history of present illness.   Otherwise, review of systems are positive for {NONE DEFAULTED:18576}.   All other systems are reviewed and negative.    PHYSICAL EXAM: VS:  There were no vitals taken for this visit. , BMI There is no height or weight on file to calculate BMI. GENERAL:  Well appearing HEENT:  Pupils equal round and reactive, fundi not visualized, oral mucosa unremarkable NECK:  No jugular venous distention, waveform within normal limits, carotid upstroke brisk and symmetric, no bruits, no thyromegaly LYMPHATICS:  No cervical, inguinal adenopathy LUNGS:  Clear to auscultation bilaterally BACK:  No CVA tenderness CHEST:  Unremarkable HEART:  PMI not displaced or sustained,S1 and S2 within normal limits, no S3, no S4, no clicks, no rubs, *** murmurs ABD:  Flat, positive bowel sounds normal in frequency in pitch, no bruits, no rebound, no guarding, no midline pulsatile mass, no hepatomegaly, no splenomegaly  EXT:  2 plus pulses throughout, no edema, no cyanosis no clubbing SKIN:  No rashes no nodules NEURO:  Cranial nerves II through XII grossly intact, motor grossly intact throughout PSYCH:  Cognitively intact, oriented to person place and time    EKG:        Recent Labs: 04/14/2023: TSH 2.519 04/15/2023: ALT 37; BUN 10; Creatinine, Ser 0.89; Hemoglobin 12.2; Magnesium 2.0; Platelets 58; Potassium 3.6; Sodium 136    Lipid Panel    Component Value Date/Time   CHOL 153 04/14/2023 0402   TRIG 139 04/14/2023 0402   HDL 47 04/14/2023 0402   CHOLHDL 3.3 04/14/2023 0402   VLDL 28 04/14/2023 0402   LDLCALC 78 04/14/2023 0402      Wt  Readings from Last 3 Encounters:  04/15/23 190 lb 11.2 oz (86.5 kg)  12/27/22 193 lb (87.5 kg)  04/01/22 203 lb 11.3 oz (92.4 kg)      Other studies Reviewed: Additional studies/ records that were reviewed today include: ***. Review of the above records demonstrates:  Please see elsewhere in the note.  ***   ASSESSMENT AND PLAN:  ***   Current medicines are reviewed at length with the patient today.  The patient {ACTIONS; HAS/DOES NOT HAVE:19233} concerns regarding medicines.  The following changes have been made:  {PLAN; NO CHANGE:13088:s}  Labs/ tests ordered today include: *** No orders of the defined types were placed in this encounter.    Disposition:   FU with ***    Signed, Rollene Rotunda, MD  06/02/2023 7:42 AM    [facility]

## 2023-06-03 ENCOUNTER — Ambulatory Visit: Payer: BC Managed Care – PPO | Admitting: Cardiology

## 2023-10-21 ENCOUNTER — Other Ambulatory Visit: Payer: Self-pay

## 2023-11-01 ENCOUNTER — Other Ambulatory Visit: Payer: Self-pay

## 2024-02-29 ENCOUNTER — Other Ambulatory Visit: Payer: Self-pay
